# Patient Record
Sex: Female | Born: 2000 | Race: White | Hispanic: Yes | Marital: Single | Smoking: Never smoker
Health system: Southern US, Community
[De-identification: ages and names within clinical notes are randomized; demographics above are authoritative.]

## PROBLEM LIST (undated history)

## (undated) DIAGNOSIS — R519 Headache, unspecified: Secondary | ICD-10-CM

## (undated) DIAGNOSIS — R51 Headache: Secondary | ICD-10-CM

## (undated) DIAGNOSIS — Z789 Other specified health status: Secondary | ICD-10-CM

## (undated) DIAGNOSIS — O24419 Gestational diabetes mellitus in pregnancy, unspecified control: Secondary | ICD-10-CM

## (undated) HISTORY — DX: Headache, unspecified: R51.9

## (undated) HISTORY — DX: Headache: R51

## (undated) HISTORY — DX: Other specified health status: Z78.9

---
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
## 2022-03-07 ENCOUNTER — Ambulatory Visit (INDEPENDENT_AMBULATORY_CARE_PROVIDER_SITE_OTHER): Payer: Medicaid Other

## 2022-03-07 ENCOUNTER — Other Ambulatory Visit (HOSPITAL_COMMUNITY)
Admission: RE | Admit: 2022-03-07 | Discharge: 2022-03-07 | Disposition: A | Discharge status: Home / Self Care | Payer: Medicaid Other | Source: Ambulatory Visit | Attending: Obstetrics and Gynecology | Admitting: Obstetrics and Gynecology

## 2022-03-07 ENCOUNTER — Ambulatory Visit: Payer: Self-pay

## 2022-03-07 VITALS — BP 99/62 | HR 79 | Ht 61.0 in | Wt 168.4 lb

## 2022-03-07 DIAGNOSIS — Z3400 Encounter for supervision of normal first pregnancy, unspecified trimester: Secondary | ICD-10-CM

## 2022-03-07 DIAGNOSIS — Z3201 Encounter for pregnancy test, result positive: Secondary | ICD-10-CM

## 2022-03-07 DIAGNOSIS — N898 Other specified noninflammatory disorders of vagina: Secondary | ICD-10-CM

## 2022-03-07 DIAGNOSIS — N912 Amenorrhea, unspecified: Secondary | ICD-10-CM

## 2022-03-07 DIAGNOSIS — O0993 Supervision of high risk pregnancy, unspecified, third trimester: Secondary | ICD-10-CM | POA: Insufficient documentation

## 2022-03-07 LAB — POCT URINE PREGNANCY: Preg Test, Ur: POSITIVE — AB

## 2022-03-07 MED ORDER — VITAFOL GUMMIES 3.33-0.333-34.8 MG PO CHEW: 3.0000 | CHEWABLE_TABLET | Freq: Every day | ORAL | 11 refills | Status: DC

## 2022-03-07 NOTE — Progress Notes (Signed)
Kathryn Doyle presents today for UPT. She has no unusual complaints. LMP: 10/09/21    OBJECTIVE: Appears well, in no apparent distress.  OB History     Gravida  1   Para  0   Term  0   Preterm  0   AB  0   Living  0      SAB  0   IAB  0   Ectopic  0   Multiple  0   Live Births  0          Home UPT Result: Positive In-Office UPT result: Positive I have reviewed the patient's medical, obstetrical, social, and family histories, and medications.   ASSESSMENT: Positive pregnancy test  PLAN Prenatal care to be completed at: [facility] Schedule for New OB intake Schedule for New OB provider     SUBJECTIVE:  21 y.o. female complains of white, malodorous, and thick vaginal discharge for 1 week(s). Denies abnormal vaginal bleeding or significant pelvic pain or fever. No UTI symptoms. Denies history of known exposure to STD.  Patient's last menstrual period was 10/09/2021.  OBJECTIVE:  She appears well, afebrile. Urine dipstick: not done.  ASSESSMENT:  Vaginal Discharge  Vaginal Odor   PLAN:  GC, chlamydia, trichomonas, BVAG, CVAG probe sent to lab. Treatment: To be determined once lab results are received ROV prn if symptoms persist or worsen.

## 2022-03-08 NOTE — Progress Notes (Signed)
Agree with nurses's documentation of this patient's clinic encounter.  Aiyannah Fayad L, MD  

## 2022-03-09 LAB — CERVICOVAGINAL ANCILLARY ONLY
Bacterial Vaginitis (gardnerella): NEGATIVE
Candida Glabrata: NEGATIVE
Candida Vaginitis: NEGATIVE
Chlamydia: NEGATIVE
Comment: NEGATIVE
Comment: NEGATIVE
Comment: NEGATIVE
Comment: NEGATIVE
Comment: NEGATIVE
Comment: NORMAL
Neisseria Gonorrhea: NEGATIVE
Trichomonas: NEGATIVE

## 2022-03-29 ENCOUNTER — Other Ambulatory Visit: Payer: Self-pay | Admitting: Obstetrics and Gynecology

## 2022-03-29 ENCOUNTER — Other Ambulatory Visit (HOSPITAL_COMMUNITY)
Admission: RE | Admit: 2022-03-29 | Discharge: 2022-03-29 | Disposition: A | Discharge status: Home / Self Care | Payer: Medicaid Other | Source: Ambulatory Visit | Attending: Obstetrics | Admitting: Obstetrics

## 2022-03-29 ENCOUNTER — Ambulatory Visit (INDEPENDENT_AMBULATORY_CARE_PROVIDER_SITE_OTHER): Payer: Medicaid Other | Admitting: General Practice

## 2022-03-29 VITALS — BP 106/68 | HR 94 | Ht 61.0 in | Wt 170.6 lb

## 2022-03-29 DIAGNOSIS — N898 Other specified noninflammatory disorders of vagina: Secondary | ICD-10-CM | POA: Insufficient documentation

## 2022-03-29 DIAGNOSIS — Z113 Encounter for screening for infections with a predominantly sexual mode of transmission: Secondary | ICD-10-CM

## 2022-03-29 MED ORDER — TERCONAZOLE 0.4 % VA CREA: 1.0000 | TOPICAL_CREAM | Freq: Every day | VAGINAL | 0 refills | Status: DC

## 2022-03-29 NOTE — Progress Notes (Signed)
SUBJECTIVE:  21 y.o. female complains of vaginal burning and irritation for 1 month(s). Denies abnormal vaginal bleeding or significant pelvic pain or fever. No UTI symptoms. Denies history of known exposure to STD.  Patient's last menstrual period was 10/09/2021.  OBJECTIVE:  She appears well, afebrile. Urine dipstick: not done.  ASSESSMENT:  Vaginal Discharge  Vaginal Odor   PLAN:  GC, chlamydia, trichomonas, BVAG, CVAG probe sent to lab. Treatment: Terazol sent to pharmacy per protocol. Further treatment to be determined once lab results are received ROV prn if symptoms persist or worsen.

## 2022-03-30 NOTE — Progress Notes (Signed)
Patient was assessed and managed by nursing staff during this encounter. I have reviewed the chart and agree with the documentation and plan. I have also made any necessary editorial changes.  Warden Fillers, MD 03/30/2022 12:03 PM

## 2022-04-02 LAB — CERVICOVAGINAL ANCILLARY ONLY
Bacterial Vaginitis (gardnerella): NEGATIVE
Candida Glabrata: NEGATIVE
Candida Vaginitis: NEGATIVE
Chlamydia: NEGATIVE
Comment: NEGATIVE
Comment: NEGATIVE
Comment: NEGATIVE
Comment: NEGATIVE
Comment: NEGATIVE
Comment: NORMAL
Neisseria Gonorrhea: NEGATIVE
Trichomonas: NEGATIVE

## 2022-04-03 ENCOUNTER — Ambulatory Visit: Payer: Medicaid Other | Admitting: *Deleted

## 2022-04-03 DIAGNOSIS — Z3400 Encounter for supervision of normal first pregnancy, unspecified trimester: Secondary | ICD-10-CM

## 2022-04-03 MED ORDER — BLOOD PRESSURE KIT DEVI: 1.0000 | 0 refills | Status: DC

## 2022-04-03 NOTE — Progress Notes (Signed)
New OB Intake   Spanish Interpreter [phone]  I connected withNAME@ on 04/03/22 at 10:15 AM EST by telephone Visit and verified that I am speaking with the correct person using two identifiers. Nurse is located at [facility] and pt is located at [place].  I discussed the limitations, risks, security and privacy concerns of performing an evaluation and management service by telephone and the availability of in person appointments. I also discussed with the patient that there may be a patient responsible charge related to this service. The patient expressed understanding and agreed to proceed.  I explained I am completing New OB Intake today. We discussed EDD of 08/03/22 that is based on [place] in [place] on 02/07/22 at [redacted]w[redacted]d. Pt is G1/P0. I reviewed her allergies, medications, Medical/Surgical/OB history, and appropriate screenings. I informed her of [facility] services. [facility] information placed in AVS. Based on history, this is a low risk pregnancy.  Patient Active Problem List   Diagnosis Date Noted   Encntr for suprvsn of normal first pregnancy, unsp trimester 03/07/2022   Migraine without aura and without status migrainosus, not intractable 07/01/2018   Episodic tension-type headache, not intractable 07/01/2018   Amenorrhea 07/01/2018    Concerns addressed today  Delivery Plans Plans to deliver at [facility]. Patient given information for [facility] website for [facility]. Patient is not interested in water birth. Offered upcoming OB visit with CNM to discuss further.  MyChart/Babyscripts MyChart access verified. I explained pt will have some visits in office and some virtually. Babyscripts instructions given and order placed. Patient verifies receipt of registration text/e-mail. Account successfully created and app downloaded.  Blood Pressure Cuff/Weight Scale Blood pressure cuff ordered for patient to pick-up from [employer]. Explained after first  prenatal appt pt will check weekly and document in Babyscripts. Patient does not have weight scale; patient may purchase if they desire to track weight weekly in Babyscripts.  Anatomy US Explained first scheduled [place] will be around 19 weeks. Anatomy US scheduled for 04/10/22 at [facility]. Pt notified to arrive at 12:30.  Labs Discussed Avelina Laine genetic screening with patient. Would like both Panorama and Horizon drawn at new OB visit. Routine prenatal labs needed.  COVID Vaccine Patient has had COVID vaccine.   Social Determinants of Health Food Insecurity: Patient denies food insecurity. WIC Referral: Patient is interested in referral to [facility].  Transportation: Patient denies transportation needs. Childcare: Discussed no children allowed at ultrasound appointments. Offered childcare services; patient declines childcare services at this time.  First visit review I reviewed new OB appt with patient. I explained they will have a provider visit that includes vaginal swab and labs. Explained pt will be seen by Dr. Clearance Coots at first visit; encounter routed to appropriate provider. Explained that patient will be seen by pregnancy navigator following visit with provider.   Harrel Lemon, RN 04/03/2022  10:18 AM

## 2022-04-11 ENCOUNTER — Other Ambulatory Visit (HOSPITAL_COMMUNITY)
Admission: RE | Admit: 2022-04-11 | Discharge: 2022-04-11 | Disposition: A | Discharge status: Home / Self Care | Payer: Medicaid Other | Source: Ambulatory Visit | Attending: Obstetrics | Admitting: Obstetrics

## 2022-04-11 ENCOUNTER — Ambulatory Visit (INDEPENDENT_AMBULATORY_CARE_PROVIDER_SITE_OTHER): Payer: Medicaid Other

## 2022-04-11 ENCOUNTER — Ambulatory Visit: Payer: Medicaid Other | Admitting: Licensed Clinical Social Worker

## 2022-04-11 ENCOUNTER — Ambulatory Visit: Payer: Medicaid Other | Attending: Obstetrics and Gynecology

## 2022-04-11 ENCOUNTER — Ambulatory Visit: Payer: Medicaid Other | Admitting: *Deleted

## 2022-04-11 VITALS — BP 127/69 | HR 90 | Wt 175.0 lb

## 2022-04-11 VITALS — BP 109/48 | HR 76

## 2022-04-11 DIAGNOSIS — Z3A23 23 weeks gestation of pregnancy: Secondary | ICD-10-CM | POA: Insufficient documentation

## 2022-04-11 DIAGNOSIS — Z3400 Encounter for supervision of normal first pregnancy, unspecified trimester: Secondary | ICD-10-CM

## 2022-04-11 DIAGNOSIS — O3482 Maternal care for other abnormalities of pelvic organs, second trimester: Secondary | ICD-10-CM | POA: Insufficient documentation

## 2022-04-11 DIAGNOSIS — O99210 Obesity complicating pregnancy, unspecified trimester: Secondary | ICD-10-CM | POA: Insufficient documentation

## 2022-04-11 DIAGNOSIS — O219 Vomiting of pregnancy, unspecified: Secondary | ICD-10-CM | POA: Diagnosis not present

## 2022-04-11 DIAGNOSIS — Z363 Encounter for antenatal screening for malformations: Secondary | ICD-10-CM | POA: Diagnosis not present

## 2022-04-11 DIAGNOSIS — O99212 Obesity complicating pregnancy, second trimester: Secondary | ICD-10-CM | POA: Diagnosis not present

## 2022-04-11 DIAGNOSIS — Z3402 Encounter for supervision of normal first pregnancy, second trimester: Secondary | ICD-10-CM | POA: Diagnosis not present

## 2022-04-11 MED ORDER — ONDANSETRON HCL 4 MG PO TABS: 4.0000 mg | ORAL_TABLET | Freq: Three times a day (TID) | ORAL | 1 refills | Status: DC | PRN

## 2022-04-11 MED ORDER — GOJJI WEIGHT SCALE MISC: 1.0000 | 0 refills | Status: DC | PRN

## 2022-04-11 MED ORDER — ASPIRIN 81 MG PO TBEC: 81.0000 mg | DELAYED_RELEASE_TABLET | Freq: Every day | ORAL | 2 refills | Status: DC

## 2022-04-11 NOTE — Progress Notes (Signed)
NOB in office, intake completed on 04/03/22

## 2022-04-11 NOTE — Progress Notes (Signed)
Subjective:   Kathryn Doyle is a 21 y.o. G1P0000 at 7w5dby 2nd trimester ultrasound being seen today for her first obstetrical visit.  Patient states this was a planned pregnancy.  Patient reports she not on birth control prior to conception. She has taken the pill in the past for menstrual regulation.   Gynecological/Obstetrical History: Patient reports no history of gynecological surgeries.  Patient without history of abnormal pap smears.   Pregnancy history fully reviewed. Patient does not intend to breast feed. Patient obstetrical history is significant for obesity and nulliparity   Sexual Activity and Vaginal Concerns: Patient is not currently sexually active and therefore pain or discomfort during intercourse was not assessed.  She endorses vaginal odor and burning. She denies discharge, or bleeding. Patient also denies pain or difficulty with urination.    Medical History/ROS: Patient denies medical history significant for cardiovascular, respiratory, gastrointestinal, or hematological disorders. Patient also denies history of MH disorders including anxiety and/or depression.   Patient denies constipation/diarrhea.  No recurrent headaches. She does have a history of migraines.   Patient reports nausea/vomiting up to 3x/day.  She reports she is not taking anything for that.     Social History: Patient denies history or current usage of tobacco, alcohol, or drugs.  Patient reports the FOB is RRoyann Shiverswho is involved, supportive, and not present today d/t work.  Patient reports that she lives with sister and her niece and nephew.  She endorses safety at home.  Patient denies DV/A. Patient is not currently employed.  HISTORY: OB History  Gravida Para Term Preterm AB Living  1 0 0 0 0 0  SAB IAB Ectopic Multiple Live Births  0 0 0 0 0    # Outcome Date GA Lbr Len/2nd Weight Sex Delivery Anes PTL Lv  1 Current             No pap smear on file or completed d/t  age.  Past Medical History:  Diagnosis Date   Headache    No pertinent past medical history    Past Surgical History:  Procedure Laterality Date   CHOLECYSTECTOMY  02/2020   Family History  Problem Relation Age of Onset   Stroke Father    Hypertension Father    Diabetes Father    Hypertension Mother    Diabetes Mother    Leukemia Other    Social History   Tobacco Use   Smoking status: Never   Smokeless tobacco: Never  Vaping Use   Vaping Use: Never used  Substance Use Topics   Alcohol use: No    Alcohol/week: 0.0 standard drinks of alcohol   Drug use: No   No Known Allergies Current Outpatient Medications on File Prior to Visit  Medication Sig Dispense Refill   Blood Pressure Monitoring (BLOOD PRESSURE KIT) DEVI 1 Device by Does not apply route once a week. 1 each 0   calcium carbonate (TUMS - DOSED IN MG ELEMENTAL CALCIUM) 500 MG chewable tablet Chew 1 tablet by mouth daily.     Prenatal Vit-Fe Fumarate-FA (BL PRENATAL VITAMINS PO) Take 1 tablet by mouth daily.     omeprazole (PRILOSEC) 20 MG capsule Take 1 capsule (20 mg total) by mouth daily. 30 capsule 0   Prenatal Vit-Fe Phos-FA-Omega (VITAFOL GUMMIES) 3.33-0.333-34.8 MG CHEW Chew 3 tablets by mouth daily. (Patient not taking: Reported on 04/03/2022) 90 tablet 11   terconazole (TERAZOL 7) 0.4 % vaginal cream Place 1 applicator vaginally at bedtime. (Patient  not taking: Reported on 04/11/2022) 45 g 0   [DISCONTINUED] albuterol (PROVENTIL HFA;VENTOLIN HFA) 108 (90 Base) MCG/ACT inhaler Inhale 2 puffs into the lungs every 6 (six) hours as needed for wheezing or shortness of breath. 1 Inhaler 2   No current facility-administered medications on file prior to visit.    Review of Systems Pertinent items noted in HPI and remainder of comprehensive ROS otherwise negative.  Exam   Vitals:   04/11/22 0822  BP: 127/69  Pulse: 90  Weight: 175 lb (79.4 kg)   Fetal Heart Rate (bpm): 147  Physical Exam Constitutional:       Appearance: Normal appearance.  HENT:     Head: Normocephalic and atraumatic.  Eyes:     Conjunctiva/sclera: Conjunctivae normal.  Cardiovascular:     Rate and Rhythm: Normal rate and regular rhythm.     Pulses: Normal pulses.  Pulmonary:     Effort: Pulmonary effort is normal. No respiratory distress.     Breath sounds: Normal breath sounds.  Abdominal:     General: Bowel sounds are normal.     Palpations: Abdomen is soft.     Tenderness: There is no abdominal tenderness.     Comments: FH 26cm  Musculoskeletal:        General: Normal range of motion.     Cervical back: Normal range of motion.  Neurological:     Mental Status: She is alert and oriented to person, place, and time.  Skin:    General: Skin is warm and dry.  Psychiatric:        Mood and Affect: Mood normal.        Behavior: Behavior normal.  Vitals reviewed.     Assessment:   21 y.o. year old G1P0000 Patient Active Problem List   Diagnosis Date Noted   Obesity in pregnancy 04/11/2022   Encntr for suprvsn of normal first pregnancy, unsp trimester 03/07/2022     Plan:  1. Encntr for suprvsn of normal first pregnancy, unsp trimester -Congratulations given and patient welcomed to practice. -Discussed usage of Babyscripts and virtual visits as additional source of managing and completing PN visits.   *Instructed to take blood pressure and record weekly into babyscripts. *Reviewed prenatal visit schedule and platforms used for virtual visits.  -Anticipatory guidance for prenatal visits including labs, ultrasounds, and testing; Initial labs done. -Genetic Screening discussed, First trimester screen, Quad screen, and NIPS: ordered. -Encouraged to complete and utilize MyChart Registration for her ability to review results, send requests, and have questions addressed.  -Discussed estimated due date of August 03, 2022. -Ultrasound discussed; fetal anatomic survey:  scheduled for today at 1300 . -Continue  prenatal vitamins  -Influenza offered and declined. -Encouraged to seek out care at office or emergency room for urgent and/or emergent concerns. -Educated on the nature of [place] with multiple MDs and other Advanced Practice Providers was explained to patient; also emphasized that residents, students are part of our team. Informed of her right to refuse care as she deems appropriate.  -No questions or concerns.    2. [redacted] weeks gestation of pregnancy -Doing well overall. -Discussed next appt with GTT. -Reviewed Glucola appt preparation including fasting the night before and morning of.   *Informed that it is okay to drink plain water throughout the night and prior to consumption of Glucola formula.  -Discussed anticipated office time of 2.5-3 hours.   -Discussed how results of GTT are handled including diabetic education and  BS testing for abnormal results and routine care for normal results.   3. Nausea and vomiting in pregnancy -Patient requests and sent script for zofran to pharmacy on file. -Cautioned re: continuous usage and side effect of constipation.   4. Obesity in pregnancy -BMI 33 today -TWG 15lbs -Discussed increased risked of PreE and bASA initiation based on BMI, Nulliparity, and Hispanic Heritage. -Patient agreeable  -Script for bASA sent to pharmacy on file.   Problem list reviewed and updated. Routine obstetric precautions reviewed.  No orders of the defined types were placed in this encounter.   No follow-ups on file.     Maryann Conners, CNM 04/11/2022 8:41 AM

## 2022-04-11 NOTE — BH Specialist Note (Unsigned)
 Opened in error

## 2022-04-12 LAB — CBC/D/PLT+RPR+RH+ABO+RUBIGG...
Antibody Screen: NEGATIVE
Basophils Absolute: 0 10*3/uL (ref 0.0–0.2)
Basos: 0 %
EOS (ABSOLUTE): 0.1 10*3/uL (ref 0.0–0.4)
Eos: 1 %
HCV Ab: NONREACTIVE
HIV Screen 4th Generation wRfx: NONREACTIVE
Hematocrit: 33.5 % — ABNORMAL LOW (ref 34.0–46.6)
Hemoglobin: 11.7 g/dL (ref 11.1–15.9)
Hepatitis B Surface Ag: NEGATIVE
Immature Grans (Abs): 0.1 10*3/uL (ref 0.0–0.1)
Immature Granulocytes: 1 %
Lymphocytes Absolute: 1.8 10*3/uL (ref 0.7–3.1)
Lymphs: 17 %
MCH: 32.7 pg (ref 26.6–33.0)
MCHC: 34.9 g/dL (ref 31.5–35.7)
MCV: 94 fL (ref 79–97)
Monocytes Absolute: 0.5 10*3/uL (ref 0.1–0.9)
Monocytes: 5 %
Neutrophils Absolute: 8.2 10*3/uL — ABNORMAL HIGH (ref 1.4–7.0)
Neutrophils: 76 %
Platelets: 329 10*3/uL (ref 150–450)
RBC: 3.58 x10E6/uL — ABNORMAL LOW (ref 3.77–5.28)
RDW: 11.6 % — ABNORMAL LOW (ref 11.7–15.4)
RPR Ser Ql: NONREACTIVE
Rh Factor: POSITIVE
Rubella Antibodies, IGG: 7 index (ref >0.99)
WBC: 10.6 10*3/uL (ref 3.4–10.8)

## 2022-04-12 LAB — CERVICOVAGINAL ANCILLARY ONLY
Bacterial Vaginitis (gardnerella): NEGATIVE
Candida Glabrata: NEGATIVE
Candida Vaginitis: NEGATIVE
Chlamydia: NEGATIVE
Comment: NEGATIVE
Comment: NEGATIVE
Comment: NEGATIVE
Comment: NEGATIVE
Comment: NEGATIVE
Comment: NORMAL
Neisseria Gonorrhea: NEGATIVE
Trichomonas: NEGATIVE

## 2022-04-12 LAB — HCV INTERPRETATION

## 2022-04-13 LAB — CULTURE, OB URINE

## 2022-04-13 LAB — AFP, SERUM, OPEN SPINA BIFIDA
AFP MoM: 0.87
AFP Value: 71.7 ng/mL
Gest. Age on Collection Date: 23.5 weeks
Maternal Age At EDD: 21 yr
OSBR Risk 1 IN: 10000
Test Results:: NEGATIVE
Weight: 175 [lb_av]

## 2022-04-13 LAB — URINE CULTURE, OB REFLEX: Organism ID, Bacteria: NO GROWTH

## 2022-04-16 LAB — PANORAMA PRENATAL TEST FULL PANEL:PANORAMA TEST PLUS 5 ADDITIONAL MICRODELETIONS: FETAL FRACTION: 9

## 2022-04-18 ENCOUNTER — Telehealth: Payer: Self-pay | Admitting: *Deleted

## 2022-04-18 LAB — HORIZON CUSTOM: REPORT SUMMARY: NEGATIVE

## 2022-04-18 LAB — SPECIMEN STATUS REPORT

## 2022-04-18 NOTE — Telephone Encounter (Signed)
TC from pt concerned with lab results from 04/11/22. Reassured that results have been reviewed by provider and there are no interventions needed at this time. Discussed normal physiologic changes of pregnancy and resulting changes in lab values. Pt verbalized understanding and reassurance.

## 2022-05-02 ENCOUNTER — Other Ambulatory Visit: Payer: Medicaid Other

## 2022-05-02 ENCOUNTER — Ambulatory Visit (INDEPENDENT_AMBULATORY_CARE_PROVIDER_SITE_OTHER): Payer: Medicaid Other | Admitting: Obstetrics & Gynecology

## 2022-05-02 ENCOUNTER — Ambulatory Visit (INDEPENDENT_AMBULATORY_CARE_PROVIDER_SITE_OTHER): Payer: Medicaid Other | Admitting: Licensed Clinical Social Worker

## 2022-05-02 ENCOUNTER — Encounter: Payer: Self-pay | Admitting: Obstetrics & Gynecology

## 2022-05-02 VITALS — BP 103/64 | HR 86 | Wt 174.0 lb

## 2022-05-02 DIAGNOSIS — Z3A26 26 weeks gestation of pregnancy: Secondary | ICD-10-CM | POA: Diagnosis not present

## 2022-05-02 DIAGNOSIS — Z3400 Encounter for supervision of normal first pregnancy, unspecified trimester: Secondary | ICD-10-CM

## 2022-05-02 DIAGNOSIS — O24419 Gestational diabetes mellitus in pregnancy, unspecified control: Secondary | ICD-10-CM

## 2022-05-02 DIAGNOSIS — Z1332 Encounter for screening for maternal depression: Secondary | ICD-10-CM

## 2022-05-02 DIAGNOSIS — K219 Gastro-esophageal reflux disease without esophagitis: Secondary | ICD-10-CM

## 2022-05-02 MED ORDER — PANTOPRAZOLE SODIUM 20 MG PO TBEC: 20.0000 mg | DELAYED_RELEASE_TABLET | Freq: Every day | ORAL | 1 refills | Status: DC

## 2022-05-02 NOTE — Progress Notes (Signed)
ROB/GTT. 

## 2022-05-02 NOTE — Progress Notes (Signed)
   PRENATAL VISIT NOTE  Subjective:  Kathryn Doyle is a 21 y.o. G1P0000 at 44w5dbeing seen today for ongoing prenatal care.  She is currently monitored for the following issues for this low-risk pregnancy and has Encntr for suprvsn of normal first pregnancy, unsp trimester and Obesity in pregnancy on their problem list.  Patient reports heartburn, nausea, and vomiting.  Contractions: Not present. Vag. Bleeding: None.  Movement: Present. Denies leaking of fluid.   The following portions of the patient's history were reviewed and updated as appropriate: allergies, current medications, past family history, past medical history, past social history, past surgical history and problem list.   Objective:   Vitals:   05/02/22 0846  BP: 103/64  Pulse: 86  Weight: 174 lb (78.9 kg)    Fetal Status: Fetal Heart Rate (bpm): 145 Fundal Height: 27 cm Movement: Present     General:  Alert, oriented and cooperative. Patient is in no acute distress.  Skin: Skin is warm and dry. No rash noted.   Cardiovascular: Normal heart rate noted  Respiratory: Normal respiratory effort, no problems with respiration noted  Abdomen: Soft, gravid, appropriate for gestational age.  Pain/Pressure: Present     Pelvic: Cervical exam deferred        Extremities: Normal range of motion.  Edema: None  Mental Status: Normal mood and affect. Normal behavior. Normal judgment and thought content.   Assessment and Plan:  Pregnancy: G1P0000 at [redacted]w[redacted]d. Encntr for suprvsn of normal first pregnancy, unsp trimester 28 week labs - Comp Met (CMET) - Glucose Tolerance, 2 Hours w/1 Hour - RPR - CBC - HIV antibody (with reflex) - pantoprazole (PROTONIX) 20 MG tablet; Take 1 tablet (20 mg total) by mouth daily.  Dispense: 30 tablet; Refill: 1  2. Gastroesophageal reflux disease, unspecified whether esophagitis present Meds ordered this encounter  Medications   pantoprazole (PROTONIX) 20 MG tablet    Sig: Take 1 tablet (20 mg  total) by mouth daily.    Dispense:  30 tablet    Refill:  1     Preterm labor symptoms and general obstetric precautions including but not limited to vaginal bleeding, contractions, leaking of fluid and fetal movement were reviewed in detail with the patient. Please refer to After Visit Summary for other counseling recommendations.   Return in about 4 weeks (around 05/30/2022).  Future Appointments  Date Time Provider [place]12/20/2023  9:30 AM FiLynnea FerrierLCSW [facility] None  05/04/2022  4:00 PM Tobb, KaGodfrey PickDO [facility] None    JaEmeterio ReeveMD

## 2022-05-03 ENCOUNTER — Other Ambulatory Visit: Payer: Self-pay | Admitting: Emergency Medicine

## 2022-05-03 DIAGNOSIS — Z3400 Encounter for supervision of normal first pregnancy, unspecified trimester: Secondary | ICD-10-CM

## 2022-05-03 LAB — COMPREHENSIVE METABOLIC PANEL
ALT: 16 IU/L (ref 0–32)
AST: 15 IU/L (ref 0–40)
Albumin/Globulin Ratio: 1.3 (ref 1.2–2.2)
Albumin: 3.5 g/dL — ABNORMAL LOW (ref 4.0–5.0)
Alkaline Phosphatase: 73 IU/L (ref 42–106)
BUN/Creatinine Ratio: 13 (ref 9–23)
BUN: 6 mg/dL (ref 6–20)
Bilirubin Total: 0.3 mg/dL (ref 0.0–1.2)
CO2: 20 mmol/L (ref 20–29)
Calcium: 8.8 mg/dL (ref 8.7–10.2)
Chloride: 107 mmol/L — ABNORMAL HIGH (ref 96–106)
Creatinine, Ser: 0.45 mg/dL — ABNORMAL LOW (ref 0.57–1.00)
Globulin, Total: 2.7 g/dL (ref 1.5–4.5)
Glucose: 80 mg/dL (ref 70–99)
Potassium: 4.1 mmol/L (ref 3.5–5.2)
Sodium: 141 mmol/L (ref 134–144)
Total Protein: 6.2 g/dL (ref 6.0–8.5)
eGFR: 141 mL/min/{1.73_m2} (ref >59)

## 2022-05-03 LAB — HIV ANTIBODY (ROUTINE TESTING W REFLEX): HIV Screen 4th Generation wRfx: NONREACTIVE

## 2022-05-03 LAB — CBC
Hematocrit: 35.2 % (ref 34.0–46.6)
Hemoglobin: 12.1 g/dL (ref 11.1–15.9)
MCH: 31.9 pg (ref 26.6–33.0)
MCHC: 34.4 g/dL (ref 31.5–35.7)
MCV: 93 fL (ref 79–97)
Platelets: 337 10*3/uL (ref 150–450)
RBC: 3.79 x10E6/uL (ref 3.77–5.28)
RDW: 11.9 % (ref 11.7–15.4)
WBC: 12.4 10*3/uL — ABNORMAL HIGH (ref 3.4–10.8)

## 2022-05-03 LAB — GLUCOSE TOLERANCE, 2 HOURS W/ 1HR
Glucose, 1 hour: 180 mg/dL — ABNORMAL HIGH (ref 70–179)
Glucose, 2 hour: 121 mg/dL (ref 70–152)
Glucose, Fasting: 76 mg/dL (ref 70–91)

## 2022-05-03 LAB — RPR: RPR Ser Ql: NONREACTIVE

## 2022-05-03 MED ORDER — PANTOPRAZOLE SODIUM 20 MG PO TBEC: 20.0000 mg | DELAYED_RELEASE_TABLET | Freq: Every day | ORAL | 1 refills | Status: DC

## 2022-05-03 NOTE — BH Specialist Note (Signed)
Integrated Behavioral Health Initial In-Person Visit  MRN: [id number] Name: Kathryn Doyle  Number of Integrated Behavioral Health Clinician visits: 1 Session Start time:   9:30am Session End time: 9:45am Total time in minutes: 15 mins in person at [facility]   Types of Service: General Behavioral Integrated Care (BHI)  Interpretor:No. Interpretor Name and Language: none   Warm Hand Off Completed.        Subjective: Kathryn Doyle is a 21 y.o. female accompanied by n/a Patient was referred by Herbie Baltimore RN for new ob intro. Patient reports the following symptoms/concerns: new ob intro  Duration of problem: n/a; Severity of problem: na  Objective: Mood: good and Affect: Appropriate Risk of harm to self or others: No plan to harm self or others  Life Context: Family and Social: Lives with family  School/Work: n/a Self-Care: n/a Life Changes: new pregnancy  Patient and/or Family's Strengths/Protective Factors: Concrete supports in place (healthy food, safe environments, etc.)  Goals Addressed: Patient will: Keep medical and prenatal appts  Prioritize rest  Take prenatal vitamins  Engage in stress reducing activity and healthy diet   Gwyndolyn Saxon, LCSW

## 2022-05-03 NOTE — Progress Notes (Signed)
Rx resent to alternate pharmacy.

## 2022-05-04 ENCOUNTER — Other Ambulatory Visit: Payer: Self-pay | Admitting: Emergency Medicine

## 2022-05-04 ENCOUNTER — Ambulatory Visit (INDEPENDENT_AMBULATORY_CARE_PROVIDER_SITE_OTHER): Payer: Medicaid Other | Admitting: Cardiology

## 2022-05-04 ENCOUNTER — Encounter: Payer: Self-pay | Admitting: Cardiology

## 2022-05-04 ENCOUNTER — Encounter: Payer: Self-pay | Admitting: Obstetrics & Gynecology

## 2022-05-04 VITALS — BP 110/70 | HR 110 | Ht 61.0 in | Wt 176.5 lb

## 2022-05-04 DIAGNOSIS — R0602 Shortness of breath: Secondary | ICD-10-CM | POA: Diagnosis not present

## 2022-05-04 DIAGNOSIS — O24410 Gestational diabetes mellitus in pregnancy, diet controlled: Secondary | ICD-10-CM

## 2022-05-04 DIAGNOSIS — O24419 Gestational diabetes mellitus in pregnancy, unspecified control: Secondary | ICD-10-CM | POA: Insufficient documentation

## 2022-05-04 MED ORDER — ACCU-CHEK MULTICLIX LANCETS MISC: 12 refills | Status: DC

## 2022-05-04 MED ORDER — ACCU-CHEK GUIDE VI STRP: ORAL_STRIP | 12 refills | Status: DC

## 2022-05-04 MED ORDER — ACCU-CHEK GUIDE ME W/DEVICE KIT: 1.0000 | PACK | Freq: Four times a day (QID) | 0 refills | Status: DC

## 2022-05-04 NOTE — Patient Instructions (Signed)
Medication Instructions:  Your physician recommends that you continue on your current medications as directed. Please refer to the Current Medication list given to you today.  *If you need a refill on your cardiac medications before your next appointment, please call your pharmacy*   Lab Work: NONE If you have labs (blood work) drawn today and your tests are completely normal, you will receive your results only by: MyChart Message (if you have MyChart) OR A paper copy in the mail If you have any lab test that is abnormal or we need to change your treatment, we will call you to review the results.   Testing/Procedures: NONE   Follow-Up: At [facility], you and your health needs are our priority.  As part of our continuing mission to provide you with exceptional heart care, we have created designated Provider Care Teams.  These Care Teams include your primary Cardiologist (physician) and Advanced Practice Providers (APPs -  Physician Assistants and Nurse Practitioners) who all work together to provide you with the care you need, when you need it.  We recommend signing up for the patient portal called "MyChart".  Sign up information is provided on this After Visit Summary.  MyChart is used to connect with patients for Virtual Visits (Telemedicine).  Patients are able to view lab/test results, encounter notes, upcoming appointments, etc.  Non-urgent messages can be sent to your provider as well.   To learn more about what you can do with MyChart, go to [email or web address].    Your next appointment:   As Needed   The format for your next appointment:   In Person  Provider:   Kardie Tobb, DO  

## 2022-05-04 NOTE — Addendum Note (Signed)
Addended by: Adam Phenix on: 05/04/2022 06:48 AM   Modules accepted: Orders

## 2022-05-04 NOTE — Progress Notes (Unsigned)
Cardio-Obstetrics Clinic  New Evaluation  Date:  05/05/2022   ID:  Kathryn Doyle, DOB 12-May-2001, MRN [id number]  PCP:  Wende Neighbors, MD   [place] Providers Cardiologist:  None  Electrophysiologist:  None       Referring MD: Gavin Pound, CNM   Chief Complaint: I am doing fine  History of Present Illness:    Kathryn Doyle is a 21 y.o. female [[id number]] who is being seen today for the evaluation of shortness of breath at the request of Gavin Pound, CNM.    Prior CV Studies Reviewed: The following studies were reviewed today: Known  Past Medical History:  Diagnosis Date   Episodic tension-type headache, not intractable 07/01/2018   Headache    Migraine without aura and without status migrainosus, not intractable 07/01/2018   No pertinent past medical history     Past Surgical History:  Procedure Laterality Date   CHOLECYSTECTOMY  02/2020      OB History     Gravida  1   Para  0   Term  0   Preterm  0   AB  0   Living  0      SAB  0   IAB  0   Ectopic  0   Multiple  0   Live Births  0               Current Medications: Current Meds  Medication Sig   Blood Glucose Monitoring Suppl (ACCU-CHEK GUIDE ME) w/Device KIT 1 Device by Does not apply route in the morning, at noon, in the evening, and at bedtime.   calcium carbonate (TUMS - DOSED IN MG ELEMENTAL CALCIUM) 500 MG chewable tablet Chew 1 tablet by mouth daily.   glucose blood (ACCU-CHEK GUIDE) test strip Take blood sugars 4 times a day. Take when fasting before breakfast, after breakfast, after lunch and after dinner.   Lancets (ACCU-CHEK MULTICLIX) lancets Use as instructed   Misc. Devices (GOJJI WEIGHT SCALE) MISC 1 Device by Does not apply route as needed.   Prenatal Vit-Fe Fumarate-FA (BL PRENATAL VITAMINS PO) Take 1 tablet by mouth daily.     Allergies:   Patient has no known allergies.   Social History   Socioeconomic History   Marital status:  Single    Spouse name: Not on file   Number of children: Not on file   Years of education: Not on file   Highest education level: Not on file  Occupational History   Not on file  Tobacco Use   Smoking status: Never   Smokeless tobacco: Never  Vaping Use   Vaping Use: Never used  Substance and Sexual Activity   Alcohol use: No    Alcohol/week: 0.0 standard drinks of alcohol   Drug use: No   Sexual activity: Yes    Partners: Male    Birth control/protection: None    Comment: currently pregnant  Other Topics Concern   Not on file  Social History Narrative   Kathryn Doyle is an 11th grade student.   She attends [employer].   She lives with both parents. She has six sisters.   She enjoys sleeping, shopping, and watching tv.   Social Determinants of Health   Financial Resource Strain: Not on file  Food Insecurity: Not on file  Transportation Needs: Not on file  Physical Activity: Not on file  Stress: Not on file  Social Connections: Not on file      Family History  Problem Relation Age of Onset   Stroke Father    Hypertension Father    Diabetes Father    Hypertension Mother    Diabetes Mother    Leukemia Other       ROS:   Please see the history of present illness.    Mild shortness of breath All other systems reviewed and are negative.   Labs/EKG Reviewed:    EKG:   EKG is was not ordered today.  Recent Labs: 05/02/2022: ALT 16; BUN 6; Creatinine, Ser 0.45; Hemoglobin 12.1; Platelets 337; Potassium 4.1; Sodium 141   Recent Lipid Panel No results found for: "CHOL", "TRIG", "HDL", "CHOLHDL", "LDLCALC", "LDLDIRECT"  Physical Exam:    VS:  BP 110/70   Pulse (!) 110   Ht _0  (1.549 m)   Wt 176 lb 8 oz (80.1 kg)   LMP 10/09/2021   BMI 33.35 kg/m     Wt Readings from Last 3 Encounters:  05/04/22 176 lb 8 oz (80.1 kg)  05/02/22 174 lb (78.9 kg)  04/11/22 175 lb (79.4 kg)     GEN:  Well nourished, well developed in no acute distress HEENT:  Normal NECK: No JVD; No carotid bruits LYMPHATICS: No lymphadenopathy CARDIAC: RRR, no murmurs, rubs, gallops RESPIRATORY:  Clear to auscultation without rales, wheezing or rhonchi  ABDOMEN: Soft, non-tender, non-distended MUSCULOSKELETAL:  No edema; No deformity  SKIN: Warm and dry NEUROLOGIC:  Alert and oriented x 3 PSYCHIATRIC:  Normal affect    Risk Assessment/Risk Calculators:                 ASSESSMENT & PLAN:    Shortness of breath, she does have mild shortness of breath with activity but not worsening and not at rest.  Will continue to monitor.  Patient.  This time there is no need for any further testing if shortness of breath progresses especially at rest we will go ahead and get an echocardiogram.  The patient is in agreement with the above plan. The patient left the office in stable condition.  The patient will follow up in as needed   Patient Instructions  Medication Instructions:  Your physician recommends that you continue on your current medications as directed. Please refer to the Current Medication list given to you today.  *If you need a refill on your cardiac medications before your next appointment, please call your pharmacy*   Lab Work: NONE If you have labs (blood work) drawn today and your tests are completely normal, you will receive your results only by: [place] (if you have MyChart) OR A paper copy in the mail If you have any lab test that is abnormal or we need to change your treatment, we will call you to review the results.   Testing/Procedures: NONE   Follow-Up: At [facility], you and your health needs are our priority.  As part of our continuing mission to provide you with exceptional heart care, we have created designated Provider Care Teams.  These Care Teams include your primary Cardiologist (physician) and Advanced Practice Providers (APPs -  Physician Assistants and Nurse Practitioners) who all work together to  provide you with the care you need, when you need it.  We recommend signing up for the patient portal called "MyChart".  Sign up information is provided on this After Visit Summary.  MyChart is used to connect with patients for Virtual Visits (Telemedicine).  Patients are able to view lab/test results, encounter notes, upcoming appointments, etc.  Non-urgent  messages can be sent to your provider as well.   To learn more about what you can do with MyChart, go to [email or web address].    Your next appointment:   As Needed   The format for your next appointment:   In Person  Provider:   Berniece Salines, DO   Dispo:  Return if symptoms worsen or fail to improve.   Medication Adjustments/Labs and Tests Ordered: Current medicines are reviewed at length with the patient today.  Concerns regarding medicines are outlined above.  Tests Ordered: No orders of the defined types were placed in this encounter.  Medication Changes: No orders of the defined types were placed in this encounter.

## 2022-05-09 ENCOUNTER — Ambulatory Visit: Payer: Medicaid Other

## 2022-05-14 NOTE — L&D Delivery Note (Cosign Needed Addendum)
OB/GYN Faculty Practice Delivery Note  Kathryn Doyle is a 22 y.o. G1P0000 s/p SVD at [redacted]w[redacted]d. She was admitted for IOL for GDM.   ROM: 8h 48m with clear fluid GBS Status:  Negative/-- (02/28 1136) Maximum Maternal Temperature:  Temp (24hrs), Avg:98 F (36.7 C), Min:97.6 F (36.4 C), Max:98.4 F (36.9 C)    Labor Progress: Patient arrived at 0 cm dilation and was induced with misoprostol, pitocin, foley.   Delivery Date/Time: 07/28/2022 at 2136 Delivery: Called to room and patient was complete and pushing. Head delivered in OA position. Loose cord present, reduced after delivery. Shoulder and body delivered in usual fashion. Infant with spontaneous cry, placed on mother's abdomen, dried and stimulated. Cord clamped x 2 after 1-minute delay, and cut by FOB. Cord blood drawn. Placenta delivered spontaneously with gentle cord traction. Fundus firm with massage and Pitocin. Labia, perineum, vagina, and cervix inspected with R periurethral and R sulcul, repaired by Dr. Janus Molder.   Placenta: delivered spontaneously and intact Complications: none Lacerations: R periurethral and R sulcul with 4.0 monocryl and 3.0 vicryl respectively  EBL: 325 Analgesia: epidural   Infant: APGAR (1 MIN): 8   APGAR (5 MINS): 9   APGAR (10 MINS):    Weight: TBD  Arlyce Dice, MD  PGY-1, [facility]  07/28/2022 10:09 PM   GME ATTESTATION:  I saw and evaluated the patient during the procedure. I agree with the findings and the plan of care as documented in the resident's note. I have made changes to documentation as necessary.  Gerlene Fee, DO OB Fellow, [place] for [place] 07/28/2022, 10:22 PM

## 2022-05-16 ENCOUNTER — Encounter: Payer: Medicaid Other | Attending: Obstetrics & Gynecology | Admitting: Registered"

## 2022-05-16 ENCOUNTER — Encounter: Payer: Self-pay | Admitting: Registered"

## 2022-05-16 DIAGNOSIS — O24419 Gestational diabetes mellitus in pregnancy, unspecified control: Secondary | ICD-10-CM | POA: Insufficient documentation

## 2022-05-16 NOTE — Progress Notes (Signed)
The following learning objectives were met by the patient during this course:   States the definition of Gestational Diabetes States why dietary management is important in controlling blood glucose Describes the effects each nutrient has on blood glucose levels Demonstrates ability to create a balanced meal plan Demonstrates carbohydrate counting  States when to check blood glucose levels Demonstrates proper blood glucose monitoring techniques States the effect of stress and exercise on blood glucose levels States the importance of limiting caffeine and abstaining from alcohol and smoking   Blood glucose monitor given: None. Patient has meter and checking blood sugar prior to class.   Patient instructed to monitor glucose levels: FBS: 60 - <95; 1 hour: <140; 2 hour: <120   Patient received handouts: Nutrition Diabetes and Pregnancy, including carb counting list Glucose log sheet   Patient will be seen for follow-up as needed.

## 2022-05-21 ENCOUNTER — Other Ambulatory Visit: Payer: Self-pay | Admitting: Emergency Medicine

## 2022-05-21 MED ORDER — PANTOPRAZOLE SODIUM 20 MG PO TBEC: 20.0000 mg | DELAYED_RELEASE_TABLET | Freq: Every day | ORAL | 1 refills | Status: DC

## 2022-05-21 NOTE — Progress Notes (Signed)
Rx reordered- Pt never picked up from pharmacy from 12/21 order.

## 2022-05-22 ENCOUNTER — Encounter: Payer: Self-pay | Admitting: Obstetrics & Gynecology

## 2022-05-30 ENCOUNTER — Ambulatory Visit (INDEPENDENT_AMBULATORY_CARE_PROVIDER_SITE_OTHER): Payer: Medicaid Other

## 2022-05-30 VITALS — BP 126/74 | HR 109 | Wt 183.8 lb

## 2022-05-30 DIAGNOSIS — Z3A30 30 weeks gestation of pregnancy: Secondary | ICD-10-CM | POA: Diagnosis not present

## 2022-05-30 DIAGNOSIS — Z3400 Encounter for supervision of normal first pregnancy, unspecified trimester: Secondary | ICD-10-CM

## 2022-05-30 DIAGNOSIS — O24410 Gestational diabetes mellitus in pregnancy, diet controlled: Secondary | ICD-10-CM

## 2022-05-30 DIAGNOSIS — O99210 Obesity complicating pregnancy, unspecified trimester: Secondary | ICD-10-CM

## 2022-05-30 LAB — GLUCOSE, POCT (MANUAL RESULT ENTRY): POC Glucose: 146 mg/dl — AB (ref 70–99)

## 2022-05-30 NOTE — Progress Notes (Signed)
   HIGH-RISK PREGNANCY OFFICE VISIT  Patient name: Kathryn Doyle MRN [id number]  Date of birth: 2000/09/01 Chief Complaint:   Routine Prenatal Visit  Subjective:   Kathryn Doyle is a 22 y.o. G35P0000 female at [redacted]w[redacted]d with an Estimated Date of Delivery: 08/03/22 being seen today for ongoing management of a high-risk pregnancy aeb has Encntr for suprvsn of normal first pregnancy, unsp trimester; Obesity in pregnancy; and Gestational diabetes mellitus on their problem list.  Patient presents today, alone, with  pelvic pain .  Patient endorses fetal movement. Patient denies abdominal cramping or contractions.  Patient denies vaginal concerns including abnormal discharge, leaking of fluid, and bleeding. No issues with urination, constipation, or diarrhea. Miss. Amybeth states she has not been checking her CBGs d/t fear of abnormal results.  She endorses having all her equipment and completed diabetic education.    Contractions: Not present. Vag. Bleeding: None.  Movement: Present.  Reviewed past medical,surgical, social, obstetrical and family history as well as problem list, medications and allergies.  Objective   Vitals:   05/30/22 1104  BP: 126/74  Pulse: (!) 109  Weight: 183 lb 12.8 oz (83.4 kg)  Body mass index is 34.73 kg/m.  Total Weight Gain:23 lb 12.8 oz (10.8 kg)         Physical Examination:   General appearance: Well appearing, and in no distress  Mental status: Alert, oriented to person, place, and time  Skin: Warm & dry  Cardiovascular: Normal heart rate noted  Respiratory: Normal respiratory effort, no distress  Abdomen: Soft, gravid, nontender, LGA with    Pelvic: Cervical exam deferred           Extremities: Edema: None  Fetal Status: Fetal Heart Rate (bpm): 140  Movement: Present   No results found for this or any previous visit (from the past 24 hour(s)).  Assessment & Plan:  High-risk pregnancy of a 22 y.o., G1P0000 at [redacted]w[redacted]d with an Estimated Date of Delivery:  08/03/22   1. Encntr for suprvsn of normal first pregnancy, unsp trimester -Anticipatory guidance for upcoming appts. -Patient to schedule next appt in 2 weeks for an in-person visit.  2. [redacted] weeks gestation of pregnancy -Doing well  -Reviewed complaint, reassured normal. Discussed relief measures. -Reviewing pediatricians and considering 2: [facility] or [facility]  3. Diet controlled gestational diabetes mellitus (GDM) in second trimester -CBG 146 at 1130. Reports eating hashbrown and drinking iced coffee at 0900.  -Discussed rationale and importance for CBG monitoring. -Reviewed potential unwanted outcomes with uncontrolled BG including LGA infant resulting in need for C/S delivery, neonate glycemia issues. -Given information on GDM management in AVS -Order placed for antenatal testing and growth FU in 2-3 weeks as schedule allots.       Meds: No orders of the defined types were placed in this encounter.  Labs/procedures today:  Lab Orders  No laboratory test(s) ordered today     Reviewed: Preterm labor symptoms and general obstetric precautions including but not limited to vaginal bleeding, contractions, leaking of fluid and fetal movement were reviewed in detail with the patient.  All questions were answered.  Follow-up: No follow-ups on file.  No orders of the defined types were placed in this encounter.  Maryann Conners MSN, CNM 05/30/2022

## 2022-05-30 NOTE — Progress Notes (Signed)
Patient presents for [place] visit. Pt non-compliant with checking blood sugars. I informed patient of the importance of this, and she verbalized understanding.

## 2022-06-13 ENCOUNTER — Ambulatory Visit (INDEPENDENT_AMBULATORY_CARE_PROVIDER_SITE_OTHER): Payer: Medicaid Other | Admitting: Obstetrics and Gynecology

## 2022-06-13 ENCOUNTER — Encounter: Payer: Self-pay | Admitting: Obstetrics and Gynecology

## 2022-06-13 ENCOUNTER — Ambulatory Visit: Payer: Medicaid Other | Admitting: *Deleted

## 2022-06-13 ENCOUNTER — Other Ambulatory Visit: Payer: Self-pay

## 2022-06-13 ENCOUNTER — Ambulatory Visit: Payer: Medicaid Other

## 2022-06-13 VITALS — BP 129/60 | HR 97

## 2022-06-13 VITALS — BP 110/72 | HR 88 | Wt 180.0 lb

## 2022-06-13 DIAGNOSIS — O99213 Obesity complicating pregnancy, third trimester: Secondary | ICD-10-CM

## 2022-06-13 DIAGNOSIS — O99210 Obesity complicating pregnancy, unspecified trimester: Secondary | ICD-10-CM

## 2022-06-13 DIAGNOSIS — Z3400 Encounter for supervision of normal first pregnancy, unspecified trimester: Secondary | ICD-10-CM

## 2022-06-13 DIAGNOSIS — Z3A32 32 weeks gestation of pregnancy: Secondary | ICD-10-CM | POA: Diagnosis not present

## 2022-06-13 DIAGNOSIS — E669 Obesity, unspecified: Secondary | ICD-10-CM | POA: Diagnosis not present

## 2022-06-13 DIAGNOSIS — O24410 Gestational diabetes mellitus in pregnancy, diet controlled: Secondary | ICD-10-CM

## 2022-06-13 DIAGNOSIS — O24419 Gestational diabetes mellitus in pregnancy, unspecified control: Secondary | ICD-10-CM

## 2022-06-13 DIAGNOSIS — O365990 Maternal care for other known or suspected poor fetal growth, unspecified trimester, not applicable or unspecified: Secondary | ICD-10-CM | POA: Insufficient documentation

## 2022-06-13 DIAGNOSIS — O365930 Maternal care for other known or suspected poor fetal growth, third trimester, not applicable or unspecified: Secondary | ICD-10-CM | POA: Diagnosis not present

## 2022-06-13 NOTE — Progress Notes (Signed)
Subjective:  Kathryn Doyle is a 22 y.o. G1P0000 at [redacted]w[redacted]d being seen today for ongoing prenatal care.  She is currently monitored for the following issues for this high-risk pregnancy and has Encntr for suprvsn of normal first pregnancy, unsp trimester; Obesity in pregnancy; and Gestational diabetes mellitus on their problem list.  Patient reports  general discomforts of pregnancy .  Contractions: Not present. Vag. Bleeding: None.  Movement: Present. Denies leaking of fluid.   The following portions of the patient's history were reviewed and updated as appropriate: allergies, current medications, past family history, past medical history, past social history, past surgical history and problem list. Problem list updated.  Objective:   Vitals:   06/13/22 1102  BP: 110/72  Pulse: 88  Weight: 180 lb (81.6 kg)    Fetal Status: Fetal Heart Rate (bpm): 140   Movement: Present     General:  Alert, oriented and cooperative. Patient is in no acute distress.  Skin: Skin is warm and dry. No rash noted.   Cardiovascular: Normal heart rate noted  Respiratory: Normal respiratory effort, no problems with respiration noted  Abdomen: Soft, gravid, appropriate for gestational age. Pain/Pressure: Present     Pelvic:  Cervical exam deferred        Extremities: Normal range of motion.     Mental Status: Normal mood and affect. Normal behavior. Normal judgment and thought content.   Urinalysis:      Assessment and Plan:  Pregnancy: G1P0000 at [redacted]w[redacted]d  1. Encntr for suprvsn of normal first pregnancy, unsp trimester Stable  2. Gestational diabetes mellitus (GDM) in third trimester, gestational diabetes method of control unspecified Did not bring CBG readings today ? If in goal range, pt states they are Importance of following diet, check CBG's recording and bring CBG readings to allOB appts reviewed U/S today Will have pt return in 1 week to review U/S and CBG readings  Preterm labor symptoms and  general obstetric precautions including but not limited to vaginal bleeding, contractions, leaking of fluid and fetal movement were reviewed in detail with the patient. Please refer to After Visit Summary for other counseling recommendations.  Return in about 1 week (around 06/20/2022) for OB visit, face to face, MD only.   Chancy Milroy, MD

## 2022-06-13 NOTE — Progress Notes (Signed)
Pt is checking sugars at home, did not bring log today.

## 2022-06-13 NOTE — Patient Instructions (Signed)

## 2022-06-14 ENCOUNTER — Other Ambulatory Visit: Payer: Self-pay | Admitting: *Deleted

## 2022-06-14 DIAGNOSIS — O365990 Maternal care for other known or suspected poor fetal growth, unspecified trimester, not applicable or unspecified: Secondary | ICD-10-CM

## 2022-06-14 DIAGNOSIS — O24419 Gestational diabetes mellitus in pregnancy, unspecified control: Secondary | ICD-10-CM

## 2022-06-14 DIAGNOSIS — O99213 Obesity complicating pregnancy, third trimester: Secondary | ICD-10-CM

## 2022-06-17 ENCOUNTER — Inpatient Hospital Stay (HOSPITAL_BASED_OUTPATIENT_CLINIC_OR_DEPARTMENT_OTHER): Payer: Medicaid Other

## 2022-06-17 ENCOUNTER — Inpatient Hospital Stay (HOSPITAL_COMMUNITY)
Admission: AD | Admit: 2022-06-17 | Discharge: 2022-06-17 | Disposition: A | Discharge status: Home / Self Care | Payer: Medicaid Other | Attending: Obstetrics and Gynecology | Admitting: Obstetrics and Gynecology

## 2022-06-17 ENCOUNTER — Encounter (HOSPITAL_COMMUNITY): Payer: Self-pay | Admitting: Obstetrics and Gynecology

## 2022-06-17 ENCOUNTER — Other Ambulatory Visit: Payer: Self-pay

## 2022-06-17 DIAGNOSIS — Z3A33 33 weeks gestation of pregnancy: Secondary | ICD-10-CM | POA: Diagnosis not present

## 2022-06-17 DIAGNOSIS — O368130 Decreased fetal movements, third trimester, not applicable or unspecified: Secondary | ICD-10-CM | POA: Insufficient documentation

## 2022-06-17 DIAGNOSIS — O365930 Maternal care for other known or suspected poor fetal growth, third trimester, not applicable or unspecified: Secondary | ICD-10-CM

## 2022-06-17 DIAGNOSIS — O99213 Obesity complicating pregnancy, third trimester: Secondary | ICD-10-CM | POA: Diagnosis not present

## 2022-06-17 DIAGNOSIS — E669 Obesity, unspecified: Secondary | ICD-10-CM

## 2022-06-17 DIAGNOSIS — O24419 Gestational diabetes mellitus in pregnancy, unspecified control: Secondary | ICD-10-CM | POA: Diagnosis not present

## 2022-06-17 HISTORY — DX: Gestational diabetes mellitus in pregnancy, unspecified control: O24.419

## 2022-06-17 LAB — WET PREP, GENITAL
Clue Cells Wet Prep HPF POC: NONE SEEN
Sperm: NONE SEEN
Trich, Wet Prep: NONE SEEN
WBC, Wet Prep HPF POC: >=10 — AB (ref <10)
Yeast Wet Prep HPF POC: NONE SEEN

## 2022-06-17 NOTE — Progress Notes (Signed)
Patient back from [place]. [place] tech reported BPP 8/8. [name], NP notified and ok with patient staying off of fetal monitors.

## 2022-06-17 NOTE — MAU Provider Note (Signed)
History     [id number]  Arrival date and time: 06/17/22 1516    Chief Complaint  Patient presents with   Decreased Fetal Movement   Vaginal Discharge     HPI Kathryn Doyle is a 22 y.o. at [redacted]w[redacted]d who presents for decreased fetal movement & vaginal discharge.  Reports no fetal movement since yesterday. Has also noticed an increase in thick white malodorous discharge. Denies abdominal pain, dysuria, vaginal bleeding, LOF, vaginal irritation. Is being followed by MFM for FGR.   OB History     Gravida  1   Para  0   Term  0   Preterm  0   AB  0   Living  0      SAB  0   IAB  0   Ectopic  0   Multiple  0   Live Births  0           Past Medical History:  Diagnosis Date   Episodic tension-type headache, not intractable 07/01/2018   Gestational diabetes    Migraine without aura and without status migrainosus, not intractable 07/01/2018    Past Surgical History:  Procedure Laterality Date   CHOLECYSTECTOMY  02/2020    Family History  Problem Relation Age of Onset   Stroke Father    Hypertension Father    Diabetes Father    Hypertension Mother    Diabetes Mother    Leukemia Other     No Known Allergies  No current facility-administered medications on file prior to encounter.   Current Outpatient Medications on File Prior to Encounter  Medication Sig Dispense Refill   calcium carbonate (TUMS - DOSED IN MG ELEMENTAL CALCIUM) 500 MG chewable tablet Chew 1 tablet by mouth daily.     glucose blood (ACCU-CHEK GUIDE) test strip Take blood sugars 4 times a day. Take when fasting before breakfast, after breakfast, after lunch and after dinner. 100 each 12   pantoprazole (PROTONIX) 20 MG tablet Take 1 tablet (20 mg total) by mouth daily. 30 tablet 1   Prenatal Vit-Fe Fumarate-FA (BL PRENATAL VITAMINS PO) Take 1 tablet by mouth daily.     Blood Glucose Monitoring Suppl (ACCU-CHEK GUIDE ME) w/Device KIT 1 Device by Does not apply route in the morning, at  noon, in the evening, and at bedtime. 1 kit 0   Blood Pressure Monitoring (BLOOD PRESSURE KIT) DEVI 1 Device by Does not apply route once a week. (Patient not taking: Reported on 05/04/2022) 1 each 0   Lancets (ACCU-CHEK MULTICLIX) lancets Use as instructed 100 each 12   Misc. Devices (GOJJI WEIGHT SCALE) MISC 1 Device by Does not apply route as needed. 1 each 0   [DISCONTINUED] albuterol (PROVENTIL HFA;VENTOLIN HFA) 108 (90 Base) MCG/ACT inhaler Inhale 2 puffs into the lungs every 6 (six) hours as needed for wheezing or shortness of breath. 1 Inhaler 2     ROS Pertinent positives and negative per HPI, all others reviewed and negative  Physical Exam   BP (!) 111/59   Pulse 90   Temp 98.2 F (36.8 C) (Oral)   Resp 18   Ht 5\' 1"  (1.549 m)   Wt 82.2 kg   LMP 10/09/2021   SpO2 98%   BMI 34.24 kg/m   Patient Vitals for the past 24 hrs:  BP Temp Temp src Pulse Resp SpO2 Height Weight  06/17/22 1541 (!) 111/59 -- -- 90 18 98 % -- --  06/17/22 1533 117/75 98.2 F (36.8 C) Oral  100 18 -- -- --  06/17/22 1523 -- -- -- -- -- -- 5\' 1"  (1.549 m) 82.2 kg    Physical Exam Vitals and nursing note reviewed.  Constitutional:      General: She is not in acute distress.    Appearance: Normal appearance.  HENT:     Head: Normocephalic and atraumatic.  Eyes:     General: No scleral icterus.    Conjunctiva/sclera: Conjunctivae normal.  Pulmonary:     Effort: Pulmonary effort is normal. No respiratory distress.  Skin:    General: Skin is warm and dry.  Neurological:     Mental Status: She is alert.  Psychiatric:        Mood and Affect: Mood normal.        Behavior: Behavior normal.       FHT Baseline 135, moderate variability, 15x15 accels, no decels Toco: irregular Cat: 1  Labs Results for orders placed or performed during the hospital encounter of 06/17/22 (from the past 24 hour(s))  Wet prep, genital     Status: Abnormal   Collection Time: 06/17/22  4:06 PM  Result Value  Ref Range   Yeast Wet Prep HPF POC NONE SEEN NONE SEEN   Trich, Wet Prep NONE SEEN NONE SEEN   Clue Cells Wet Prep HPF POC NONE SEEN NONE SEEN   WBC, Wet Prep HPF POC >=10 (A) <10   Sperm NONE SEEN     Imaging No results found.  MAU Course  Procedures Lab Orders         Wet prep, genital    No orders of the defined types were placed in this encounter.  Imaging Orders         [place] MFM FETAL BPP WO NON STRESS      MDM Wet prep negative. GC/CT pending.   Reactive fetal tracing. Patient continued to report no fetal movement. BPP ordered. BPP 8/8 with normal AFI Assessment and Plan   1. Decreased fetal movements in third trimester, single or unspecified fetus   2. [redacted] weeks gestation of pregnancy    -Reviewed reasons to return to MAU -Patient has BPP & MFM f/u on Wednesday   [name], NP 06/17/22 4:59 PM

## 2022-06-17 NOTE — MAU Note (Signed)
.  Kathryn Doyle is a 22 y.o. at [redacted]w[redacted]d here in MAU reporting: DFM since yesterday afternoon. States she does not remember the last time she felt the baby move. States she tried lying down and drinking cold water and it did nothing. Denies any ctx, LOF, or VB. States she has white vaginal discharge that has been on and off that has an odor. Denies any vaginal pain or itching.  Gestational diabetes-diet controlled fasting glucose this morning was 79  States baby is measuring small.  Onset of complaint: yesterday  Pain score: 0 Vitals:   06/17/22 1533  BP: 117/75  Pulse: 100     FHT:135 Lab orders placed from triage:  n/a

## 2022-06-18 LAB — GC/CHLAMYDIA PROBE AMP (~~LOC~~) NOT AT ARMC
Chlamydia: NEGATIVE
Comment: NEGATIVE
Comment: NORMAL
Neisseria Gonorrhea: NEGATIVE

## 2022-06-20 ENCOUNTER — Ambulatory Visit: Payer: Medicaid Other

## 2022-06-20 ENCOUNTER — Inpatient Hospital Stay (HOSPITAL_COMMUNITY)
Admission: AD | Admit: 2022-06-20 | Discharge: 2022-06-20 | Disposition: A | Discharge status: Home / Self Care | Payer: Medicaid Other | Attending: Obstetrics & Gynecology | Admitting: Obstetrics & Gynecology

## 2022-06-20 ENCOUNTER — Inpatient Hospital Stay (HOSPITAL_BASED_OUTPATIENT_CLINIC_OR_DEPARTMENT_OTHER): Payer: Medicaid Other

## 2022-06-20 ENCOUNTER — Inpatient Hospital Stay (HOSPITAL_COMMUNITY): Payer: Medicaid Other

## 2022-06-20 ENCOUNTER — Encounter (HOSPITAL_COMMUNITY): Payer: Self-pay | Admitting: Obstetrics & Gynecology

## 2022-06-20 DIAGNOSIS — O99213 Obesity complicating pregnancy, third trimester: Secondary | ICD-10-CM

## 2022-06-20 DIAGNOSIS — O368130 Decreased fetal movements, third trimester, not applicable or unspecified: Secondary | ICD-10-CM | POA: Insufficient documentation

## 2022-06-20 DIAGNOSIS — O212 Late vomiting of pregnancy: Secondary | ICD-10-CM | POA: Insufficient documentation

## 2022-06-20 DIAGNOSIS — Z3400 Encounter for supervision of normal first pregnancy, unspecified trimester: Secondary | ICD-10-CM

## 2022-06-20 DIAGNOSIS — N83209 Unspecified ovarian cyst, unspecified side: Secondary | ICD-10-CM

## 2022-06-20 DIAGNOSIS — O99891 Other specified diseases and conditions complicating pregnancy: Secondary | ICD-10-CM | POA: Diagnosis present

## 2022-06-20 DIAGNOSIS — R109 Unspecified abdominal pain: Secondary | ICD-10-CM

## 2022-06-20 DIAGNOSIS — Z3A33 33 weeks gestation of pregnancy: Secondary | ICD-10-CM

## 2022-06-20 DIAGNOSIS — O24419 Gestational diabetes mellitus in pregnancy, unspecified control: Secondary | ICD-10-CM

## 2022-06-20 DIAGNOSIS — O26893 Other specified pregnancy related conditions, third trimester: Secondary | ICD-10-CM

## 2022-06-20 DIAGNOSIS — E669 Obesity, unspecified: Secondary | ICD-10-CM

## 2022-06-20 DIAGNOSIS — O4703 False labor before 37 completed weeks of gestation, third trimester: Secondary | ICD-10-CM

## 2022-06-20 DIAGNOSIS — O365930 Maternal care for other known or suspected poor fetal growth, third trimester, not applicable or unspecified: Secondary | ICD-10-CM | POA: Diagnosis not present

## 2022-06-20 DIAGNOSIS — O24410 Gestational diabetes mellitus in pregnancy, diet controlled: Secondary | ICD-10-CM | POA: Insufficient documentation

## 2022-06-20 DIAGNOSIS — O3483 Maternal care for other abnormalities of pelvic organs, third trimester: Secondary | ICD-10-CM

## 2022-06-20 DIAGNOSIS — O0993 Supervision of high risk pregnancy, unspecified, third trimester: Secondary | ICD-10-CM | POA: Diagnosis not present

## 2022-06-20 DIAGNOSIS — R1032 Left lower quadrant pain: Secondary | ICD-10-CM | POA: Diagnosis not present

## 2022-06-20 DIAGNOSIS — Z3689 Encounter for other specified antenatal screening: Secondary | ICD-10-CM

## 2022-06-20 LAB — URINALYSIS, ROUTINE W REFLEX MICROSCOPIC
Bilirubin Urine: NEGATIVE
Glucose, UA: NEGATIVE mg/dL
Hgb urine dipstick: NEGATIVE
Ketones, ur: 5 mg/dL — AB
Nitrite: NEGATIVE
Protein, ur: 30 mg/dL — AB
Specific Gravity, Urine: 1.025 (ref 1.005–1.030)
pH: 5 (ref 5.0–8.0)

## 2022-06-20 LAB — CBC WITH DIFFERENTIAL/PLATELET
Abs Immature Granulocytes: 0.07 10*3/uL (ref 0.00–0.07)
Basophils Absolute: 0 10*3/uL (ref 0.0–0.1)
Basophils Relative: 0 %
Eosinophils Absolute: 0 10*3/uL (ref 0.0–0.5)
Eosinophils Relative: 0 %
HCT: 37 % (ref 36.0–46.0)
Hemoglobin: 13 g/dL (ref 12.0–15.0)
Immature Granulocytes: 1 %
Lymphocytes Relative: 11 %
Lymphs Abs: 1.2 10*3/uL (ref 0.7–4.0)
MCH: 31.6 pg (ref 26.0–34.0)
MCHC: 35.1 g/dL (ref 30.0–36.0)
MCV: 89.8 fL (ref 80.0–100.0)
Monocytes Absolute: 0.6 10*3/uL (ref 0.1–1.0)
Monocytes Relative: 5 %
Neutro Abs: 9.6 10*3/uL — ABNORMAL HIGH (ref 1.7–7.7)
Neutrophils Relative %: 83 %
Platelets: 308 10*3/uL (ref 150–400)
RBC: 4.12 MIL/uL (ref 3.87–5.11)
RDW: 14 % (ref 11.5–15.5)
WBC: 11.6 10*3/uL — ABNORMAL HIGH (ref 4.0–10.5)
nRBC: 0 % (ref 0.0–0.2)

## 2022-06-20 LAB — COMPREHENSIVE METABOLIC PANEL
ALT: 40 U/L (ref 0–44)
AST: 26 U/L (ref 15–41)
Albumin: 2.7 g/dL — ABNORMAL LOW (ref 3.5–5.0)
Alkaline Phosphatase: 107 U/L (ref 38–126)
Anion gap: 11 (ref 5–15)
BUN: 7 mg/dL (ref 6–20)
CO2: 22 mmol/L (ref 22–32)
Calcium: 8.6 mg/dL — ABNORMAL LOW (ref 8.9–10.3)
Chloride: 101 mmol/L (ref 98–111)
Creatinine, Ser: 0.55 mg/dL (ref 0.44–1.00)
GFR, Estimated: >60 mL/min (ref >60)
Glucose, Bld: 101 mg/dL — ABNORMAL HIGH (ref 70–99)
Potassium: 3.8 mmol/L (ref 3.5–5.1)
Sodium: 134 mmol/L — ABNORMAL LOW (ref 135–145)
Total Bilirubin: 0.3 mg/dL (ref 0.3–1.2)
Total Protein: 6.8 g/dL (ref 6.5–8.1)

## 2022-06-20 LAB — AMYLASE: Amylase: 81 U/L (ref 28–100)

## 2022-06-20 LAB — LIPASE, BLOOD: Lipase: 35 U/L (ref 11–51)

## 2022-06-20 MED ORDER — HYDROMORPHONE HCL 1 MG/ML IJ SOLN
1.0000 mg | INTRAMUSCULAR | Status: DC | PRN
  Administered 2022-06-20: 1 mg via INTRAMUSCULAR
  Filled 2022-06-20: qty 1

## 2022-06-20 MED ORDER — ONDANSETRON 4 MG PO TBDP: 4.0000 mg | ORAL_TABLET | Freq: Four times a day (QID) | ORAL | 0 refills | Status: DC | PRN

## 2022-06-20 MED ORDER — ACETAMINOPHEN 500 MG PO TABS: 1000.0000 mg | ORAL_TABLET | Freq: Four times a day (QID) | ORAL | 1 refills | Status: DC | PRN

## 2022-06-20 MED ORDER — DOCUSATE SODIUM 100 MG PO CAPS: 100.0000 mg | ORAL_CAPSULE | Freq: Two times a day (BID) | ORAL | 2 refills | Status: DC | PRN

## 2022-06-20 MED ORDER — OXYCODONE HCL 5 MG PO TABS: 5.0000 mg | ORAL_TABLET | ORAL | 0 refills | Status: DC | PRN

## 2022-06-20 NOTE — MAU Note (Signed)
...  Kathryn Doyle is a 22 y.o. at [redacted]w[redacted]d here in MAU reporting: Occasional CTX accompanied by constant lower abdominal pain since 1140 this morning. She reports the constant pain is in the middle of her lower abdomen and radiates to her left side. She reports this pain is sharp. She reports she is feeling severe amounts of vaginal pressure. Denies VB or LOF. +FM.   Onset of complaint: 1140 Pain score:  10/10 CTX 10/10 left mid to lower abdomen   FHT: 135 initial external Lab orders placed from triage:  UA

## 2022-06-20 NOTE — MAU Provider Note (Signed)
Obstetric Attending MAU Note  Chief Complaint:  Contractions, Abdominal Pain, and Flank Pain   Event Date/Time   First Provider Initiated Contact with Patient 06/20/22 1257     HPI: Kathryn Doyle is a 22 y.o. G1P0000 at 6665w5d who presents to maternity admissions reporting constant left sided lower abdominal pain since 1140 this morning, radiating to left side and back.  Also reports having urinary odor, no dysuria.  Has known 7 cm left ovarian simple cyst, last visualized on 06/17/22 scan.  Also had episode of emesis this morning, but able to tolerate oral intake. Also thinks she may be having irregular contractions and vaginal pressure.  Denies any abnormal vaginal discharge, fevers, chills, sweats, other GI or GU symptoms or other general symptoms. Denies  leakage of fluid or vaginal bleeding. Good fetal movement.   P[facility] Course: [facility] Patient Active Problem List   Diagnosis Date Noted   Ovarian cyst 06/20/2022   Fetal growth restriction antepartum 06/13/2022   Gestational diabetes mellitus 05/04/2022   Obesity in pregnancy 04/11/2022   Supervision of high risk pregnancy in third trimester 03/07/2022    Past Medical History:  Diagnosis Date   Episodic tension-type headache, not intractable 07/01/2018   Gestational diabetes    Migraine without aura and without status migrainosus, not intractable 07/01/2018    OB History  Gravida Para Term Preterm AB Living  1 0 0 0 0 0  SAB IAB Ectopic Multiple Live Births  0 0 0 0 0    # Outcome Date GA Lbr Len/2nd Weight Sex Delivery Anes PTL Lv  1 Current             Past Surgical History:  Procedure Laterality Date   CHOLECYSTECTOMY  02/2020    Family History: Family History  Problem Relation Age of Onset   Stroke Father    Hypertension Father    Diabetes Father    Hypertension Mother    Diabetes Mother    Leukemia Other     Social History: Social History   Tobacco Use   Smoking status: Never    Smokeless tobacco: Never  Vaping Use   Vaping Use: Never used  Substance Use Topics   Alcohol use: No    Alcohol/week: 0.0 standard drinks of alcohol   Drug use: No    Allergies: No Known Allergies  Medications Prior to Admission  Medication Sig Dispense Refill Last Dose   Blood Glucose Monitoring Suppl (ACCU-CHEK GUIDE ME) w/Device KIT 1 Device by Does not apply route in the morning, at noon, in the evening, and at bedtime. 1 kit 0    Blood Pressure Monitoring (BLOOD PRESSURE KIT) DEVI 1 Device by Does not apply route once a week. (Patient not taking: Reported on 05/04/2022) 1 each 0    calcium carbonate (TUMS - DOSED IN MG ELEMENTAL CALCIUM) 500 MG chewable tablet Chew 1 tablet by mouth daily.      glucose blood (ACCU-CHEK GUIDE) test strip Take blood sugars 4 times a day. Take when fasting before breakfast, after breakfast, after lunch and after dinner. 100 each 12    Lancets (ACCU-CHEK MULTICLIX) lancets Use as instructed 100 each 12    Misc. Devices (GOJJI WEIGHT SCALE) MISC 1 Device by Does not apply route as needed. 1 each 0    pantoprazole (PROTONIX) 20 MG tablet Take 1 tablet (20 mg total) by mouth daily. 30 tablet 1    Prenatal Vit-Fe Fumarate-FA (BL PRENATAL VITAMINS PO) Take 1 tablet by  mouth daily.       ROS: Pertinent findings in history of present illness.  Physical Exam  Blood pressure 113/79, pulse 82, temperature 97.9 F (36.6 C), temperature source Oral, resp. rate 17, height 5\' 1"  (1.549 m), last menstrual period 10/09/2021, SpO2 100 %. CONSTITUTIONAL: Well-developed, well-nourished female in no acute distress.  HENT:  Normocephalic, atraumatic, External right and left ear normal. Oropharynx is clear and moist EYES: Conjunctivae and EOM are normal. Pupils are equal, round, and reactive to light. No scleral icterus.  NECK: Normal range of motion, supple, no masses SKIN: Skin is warm and dry. No rash noted. Not diaphoretic. No erythema. No pallor. NEUROLGIC: Alert  and oriented to person, place, and time. Normal reflexes, muscle tone coordination. No cranial nerve deficit noted. PSYCHIATRIC: Normal mood and affect. Normal behavior. Normal judgment and thought content. CARDIOVASCULAR: Normal heart rate noted, regular rhythm RESPIRATORY: Effort and breath sounds normal, no problems with respiration noted ABDOMEN: Soft, moderate LLQ/left flank pain, no rebound or guarding, nondistended, gravid appropriate for gestational age MUSCULOSKELETAL: Normal range of motion. No edema and no tenderness. 2+ distal pulses.  SPECULUM EXAM: NEFG, physiologic discharge, no blood, cervix clean Dilation: Closed Effacement (%): Thick Exam by:: Dr. Macon Large  FHT:  Baseline 30 , moderate variability, accelerations present, no decelerations Contractions: None   Labs: Results for orders placed or performed during the hospital encounter of 06/20/22 (from the past 24 hour(s))  Urinalysis, Routine w reflex microscopic -Urine, Clean Catch     Status: Abnormal   Collection Time: 06/20/22 12:45 PM  Result Value Ref Range   Color, Urine YELLOW YELLOW   APPearance TURBID (A) CLEAR   Specific Gravity, Urine 1.025 1.005 - 1.030   pH 5.0 5.0 - 8.0   Glucose, UA NEGATIVE NEGATIVE mg/dL   Hgb urine dipstick NEGATIVE NEGATIVE   Bilirubin Urine NEGATIVE NEGATIVE   Ketones, ur 5 (A) NEGATIVE mg/dL   Protein, ur 30 (A) NEGATIVE mg/dL   Nitrite NEGATIVE NEGATIVE   Leukocytes,Ua SMALL (A) NEGATIVE   RBC / HPF 11-20 0 - 5 RBC/hpf   WBC, UA 21-50 0 - 5 WBC/hpf   Bacteria, UA MANY (A) NONE SEEN   Squamous Epithelial / HPF 21-50 0 - 5 /HPF   Mucus PRESENT   CBC with Differential/Platelet     Status: Abnormal   Collection Time: 06/20/22  1:22 PM  Result Value Ref Range   WBC 11.6 (H) 4.0 - 10.5 K/uL   RBC 4.12 3.87 - 5.11 MIL/uL   Hemoglobin 13.0 12.0 - 15.0 g/dL   HCT 16.1 09.6 - 04.5 %   MCV 89.8 80.0 - 100.0 fL   MCH 31.6 26.0 - 34.0 pg   MCHC 35.1 30.0 - 36.0 g/dL   RDW 40.9  81.1 - 91.4 %   Platelets 308 150 - 400 K/uL   nRBC 0.0 0.0 - 0.2 %   Neutrophils Relative % 83 %   Neutro Abs 9.6 (H) 1.7 - 7.7 K/uL   Lymphocytes Relative 11 %   Lymphs Abs 1.2 0.7 - 4.0 K/uL   Monocytes Relative 5 %   Monocytes Absolute 0.6 0.1 - 1.0 K/uL   Eosinophils Relative 0 %   Eosinophils Absolute 0.0 0.0 - 0.5 K/uL   Basophils Relative 0 %   Basophils Absolute 0.0 0.0 - 0.1 K/uL   Immature Granulocytes 1 %   Abs Immature Granulocytes 0.07 0.00 - 0.07 K/uL    Imaging:  US Renal  Result Date: 06/20/2022 CLINICAL  DATA:  Left flank pain EXAM: RENAL / URINARY TRACT ULTRASOUND COMPLETE COMPARISON:  01/02/2005 FINDINGS: Right Kidney: Renal measurements: 8.8 x 4.7 x 5.0 cm = volume: 106 mL. Echogenicity within normal limits. No mass or hydronephrosis visualized. Left Kidney: Renal measurements: 10.1 x 5.1 x 4.0 cm = volume: 106 mL. Echogenicity within normal limits. No mass or hydronephrosis visualized. Bladder: Decompressed. Other: None. IMPRESSION: No evidence of obstructive uropathy. Electronically Signed   By: Duanne Guess D.O.   On: 06/20/2022 14:21   [place] MFM FETAL BPP WO NON STRESS  Result Date: 06/18/2022 ----------------------------------------------------------------------  OBSTETRICS REPORT                       (Signed Final 06/18/2022 03:10 pm) ---------------------------------------------------------------------- Patient Info  ID #:       [id number]                          D.O.B.:  15-Sep-2000 (21 yrs)  Name:       Kathryn Doyle                  Visit Date: 06/17/2022 04:26 pm ---------------------------------------------------------------------- Performed By  Attending:        Braxton Feathers DO       Ref. Address:     [address]                                                             [address]                                                             [place]                                                              [address]  Performed By:     Hurman Horn          [facility]  Referred By:      [facility] ---------------------------------------------------------------------- Orders  #  Description  Code        Ordered By  1  US MFM FETAL BPP WO NON               [id number]    ERIN LAWRENCE     STRESS ----------------------------------------------------------------------  #  Order #                     Accession #                Episode #  1  [id number]                   [id number]                 [id number] ---------------------------------------------------------------------- Indications  Decreased fetal movement                       O36.8190  Gestational diabetes in pregnancy,             O24.419  unspecified control  Obesity complicating pregnancy (BMI 30)        O99.210 E66.9  Fetal growth restriction                       O36.5930  [redacted] weeks gestation of pregnancy                Z3A.33 ---------------------------------------------------------------------- Fetal Evaluation  Num Of Fetuses:         1  Fetal Heart Rate(bpm):  128  Cardiac Activity:       Observed  Presentation:           Cephalic  Placenta:               Anterior  P. Cord Insertion:      Visualized, central  Amniotic Fluid  AFI FV:      Within normal limits  AFI Sum(cm)     %Tile       Largest Pocket(cm)  12.5            37          4.6  RUQ(cm)       RLQ(cm)       LUQ(cm)        LLQ(cm)  3.7           2.1           4.6            2.1 ---------------------------------------------------------------------- Biophysical Evaluation  Amniotic F.V:   Within normal limits       F. Tone:        Observed  F. Movement:    Observed                   Score:          8/8  F. Breathing:   Observed ---------------------------------------------------------------------- OB History  Gravidity:    1         Term:   0        Prem:   0        SAB:    0  TOP:          0       Ectopic:  0        Living: 0 ---------------------------------------------------------------------- Gestational Age  LMP:           35w 6d        Date:  10/09/21  EDD:   07/16/22  Best:          33w 2d     Det. ByMarcella Dubs         EDD:   08/03/22                                      (02/07/22) ---------------------------------------------------------------------- Cervix Uterus Adnexa  Cervix  Not visualized (advanced GA >24wks)  Uterus  No abnormality visualized.  Right Ovary  Within normal limits.  Left Ovary  Simple cyst measuring. (7.0 x 5.2 x 5.4cm) ---------------------------------------------------------------------- Comments  Hospital Ultrasound  33w 2d at the MAU for decreased fetal movement. EDD:  08/03/2022 by Early Ultrasound  (02/07/22).  Sonographic findings  Single intrauterine pregnancy.  Fetal cardiac activity: Observed and appears normal.  Presentation: Cephalic.  Limited fetal anatomy appears normal.  Amniotic fluid volume: Within normal limits. AFI: 12.5 cm.  MVP: 4.6 cm.  Placenta: Anterior. There is no sonographic evidence of  bleeding.  BPP: 8/8.  The left ovary has a large (7cm) simple cyst.  Recommendations  - Continue clinical management per OB provider  - F/u ultrasound  This was a limited ultrasound with a remote read. If an official  MFM consult is requested for any reason please call/place an  order in Epic. ----------------------------------------------------------------------                 Braxton Feathers, DO Electronically Signed Final Report   06/18/2022 03:10 pm ----------------------------------------------------------------------  [place] MFM OB FOLLOW UP  Result Date: 06/13/2022 ----------------------------------------------------------------------  OBSTETRICS REPORT                       (Signed Final 06/13/2022 04:53 pm) ---------------------------------------------------------------------- Patient Info  ID #:       [id number]                           D.O.B.:  13-Dec-2000 (21 yrs)  Name:       Kathryn Doyle                  Visit Date: 06/13/2022 04:20 pm ---------------------------------------------------------------------- Performed By  Attending:        Braxton Feathers DO       Ref. Address:     [address]                                                             [phone]  [place]                                                             [address]  Performed By:     Anabel Halon          [facility]  Referred By:      [facility] ---------------------------------------------------------------------- Orders  #  Description                           Code        Ordered By  1  [place] MFM OB FOLLOW UP                   76816.01    JESSICA EMLY  2  [place] MFM FETAL BPP WO NON               76819.01    JESSICA EMLY     STRESS  3  [place] MFM UA CORD DOPPLER                76820.02    JESSICA EMLY ----------------------------------------------------------------------  #  Order #                     Accession #                Episode #  1  [id number]                   [id number]                 [id number]  2  [id number]                   [id number]                 [id number]  3  [id number]                   [id number]                 [id number] ---------------------------------------------------------------------- Indications  Gestational diabetes in pregnancy,  O24.419  unspecified control  Obesity complicating pregnancy (BMI 30)        O99.210 E66.9  [redacted] weeks gestation of pregnancy                Z3A.32  Encounter for other antenatal screening        Z36.2  follow-up  LR NIPS/Neg Horizon/Neg AFP   Fetal growth restriction                       O36.5930 ---------------------------------------------------------------------- Fetal Evaluation  Num Of Fetuses:         1  Fetal Heart Rate(bpm):  141  Cardiac Activity:       Observed  Presentation:           Cephalic  Placenta:               Anterior  P. Cord Insertion:      Previously visualized  Amniotic Fluid  AFI FV:      Within normal limits  AFI Sum(cm)     %Tile       Largest Pocket(cm)  11.1            26          4.  RUQ(cm)       RLQ(cm)       LUQ(cm)        LLQ(cm)  3.7           4             3.4            0 ---------------------------------------------------------------------- Biophysical Evaluation  Amniotic F.V:   Pocket => 2 cm             F. Tone:        Observed  F. Movement:    Observed                   Score:          8/8  F. Breathing:   Observed ---------------------------------------------------------------------- Biometry  BPD:      81.1  mm     G. Age:  32w 4d         39  %    CI:        81.27   %    70 - 86                                                          FL/HC:      20.4   %    19.9 - 21.5  HC:       284   mm     G. Age:  31w 1d        1.3  %    HC/AC:      1.03        0.96 - 1.11  AC:      275.1  mm     G. Age:  31w 4d         19  %    FL/BPD:     71.5   %    71 - 87  FL:         58  mm     G. Age:  30w 2d        1.9  %    FL/AC:      21.1   %    20 - 24  Est. FW:    1727  gm    3 lb 13 oz       8  % ---------------------------------------------------------------------- OB History  Gravidity:    1         Term:   0        Prem:   0        SAB:   0  TOP:          0       Ectopic:  0        Living: 0 ---------------------------------------------------------------------- Gestational Age  LMP:           35w 2d        Date:  10/09/21                  EDD:   07/16/22  U/S Today:     31w 3d                                        EDD:   08/12/22  Best:          32w 5d     Det. ByLoman Chroman         EDD:   08/03/22                                       (02/07/22) ---------------------------------------------------------------------- Anatomy  Cranium:               Appears normal         LVOT:                   Previously seen  Cavum:                 Previously             Aortic Arch:            Previously seen                         visualized  Ventricles:            Previously seen        Ductal Arch:            Previously seen  Choroid Plexus:        Previously seen        Diaphragm:              Appears normal  Cerebellum:            Previously seen        Stomach:                Appears normal, left  sided  Posterior Fossa:       Previously seen        Abdomen:                Previously seen  Nuchal Fold:           Not applicable (>20    Abdominal Wall:         Previously seen                         wks GA)  Face:                  Orbits and profile     Cord Vessels:           Previously seen                         previously seen  Lips:                  Previously seen        Kidneys:                Appear normal  Palate:                Previously             Bladder:                Appears normal                         visualized  Thoracic:              Previously seen        Spine:                  Previously seen  Heart:                 Appears normal;        Upper Extremities:      Previously seen                         Left EIF prev seen  RVOT:                  Previously seen        Lower Extremities:      Previously seen  Other:  Female gender previously seen. Heels prev visualized. Open hands          prev visualized. Nasal bone prev visualized. 3VV prev visualized. ---------------------------------------------------------------------- Doppler - Fetal Vessels  Umbilical Artery   S/D     %tile      RI    %tile      PI    %tile            ADFV    RDFV    3.1       77     0.7       86     1.1       85               No      No  ---------------------------------------------------------------------- Cervix Uterus Adnexa  Cervix  Not visualized (advanced GA >24wks)  Uterus  No abnormality visualized.  Right Ovary  Not visualized.  Left Ovary  Simple cyst measuring 7x5x6 cm  Cul De Sac  No free fluid seen.  Adnexa  No abnormality visualized ---------------------------------------------------------------------- Comments  The patient is here for a follow-up ultrasound for gDM and  new onset FGR  at 32w 5d. EDD: 08/03/2022. Dating: Early  Ultrasound  (02/07/22). She has no concerns today and  reports most of her blood sugars are well controlled.  Sonographic findings  Single intrauterine pregnancy.  Fetal cardiac activity:  Observed and appears normal.  Presentation: Cephalic.  Interval fetal anatomy appears normal.  Fetal biometry shows the estimated fetal weight at the 8  percentile and the abdominal circumference at the 19  percentile.  Amniotic fluid volume: Within normal limits. AFI: 11.1 cm.  MVP: 4. cm.  Placenta: Anterior.  Umbilical artery dopplers findings:  -S/D:3.1 which are normal at this gestational age.  -Absent end-diastolic flow: No.  -Reversed end-diastolic flow:  No.  BPP 8/8.  Recommendations  I discussed the new diagnosis and potential causes and  clinical prognosis of FGR.  - Continue weekly UA dopplers and BPP until delivery.  - Serial growth [place] every 3 weeks until delivery.  - Delivery likely around 37 weeks or sooner if indicated.  - I encouraged her to bring her blood sugar log and  expressed the risks such as fetal hypoxia and stillbirth in the  setting of FGR and diabetes ----------------------------------------------------------------------                  Braxton Feathers, DO Electronically Signed Final Report   06/13/2022 04:53 pm ----------------------------------------------------------------------  [place] MFM FETAL BPP WO NON STRESS  Result Date:  06/13/2022 ----------------------------------------------------------------------  OBSTETRICS REPORT                       (Signed Final 06/13/2022 04:53 pm) ---------------------------------------------------------------------- Patient Info  ID #:       [id number]                          D.O.B.:  [birth year] (21 yrs)  Name:       Kathryn Doyle                  Visit Date: 06/13/2022 04:20 pm ---------------------------------------------------------------------- Performed By  Attending:        Braxton Feathers DO       Ref. Address:     [address]                                                             [address]                                                             [place]  [address]  Performed By:     Anabel Halon          [facility]  Referred By:      [facility] ---------------------------------------------------------------------- Orders  #  Description                           Code        Ordered By  1  [place] MFM OB FOLLOW UP                   76816.01    JESSICA EMLY  2  [place] MFM FETAL BPP WO NON               76819.01    JESSICA EMLY     STRESS  3  [place] MFM UA CORD DOPPLER                76820.02    JESSICA EMLY ----------------------------------------------------------------------  #  Order #                     Accession #                Episode #  1  [id number]                   [id number]                 [id number]  2  [id number]                   [id number]                 [id number]  3  [id number]                   [id number]                 [id number] ---------------------------------------------------------------------- Indications  Gestational  diabetes in pregnancy,             O24.419  unspecified control  Obesity complicating pregnancy (BMI 30)        O99.210 E66.9  [redacted] weeks gestation of pregnancy                Z3A.32  Encounter for other antenatal screening        Z36.2  follow-up  LR NIPS/Neg Horizon/Neg AFP  Fetal growth restriction                       O36.5930 ---------------------------------------------------------------------- Fetal Evaluation  Num Of Fetuses:         1  Fetal Heart Rate(bpm):  141  Cardiac Activity:       Observed  Presentation:           Cephalic  Placenta:               Anterior  P. Cord Insertion:      Previously visualized  Amniotic Fluid  AFI FV:      Within normal limits  AFI Sum(cm)     %Tile       Largest Pocket(cm)  11.1            26          4.  RUQ(cm)       RLQ(cm)       LUQ(cm)        LLQ(cm)  3.7           4             3.4            0 ---------------------------------------------------------------------- Biophysical Evaluation  Amniotic F.V:   Pocket => 2 cm             F. Tone:        Observed  F. Movement:    Observed                   Score:          8/8  F. Breathing:   Observed ---------------------------------------------------------------------- Biometry  BPD:      81.1  mm     G. Age:  32w 4d         39  %    CI:        81.27   %    70 - 86                                                          FL/HC:      20.4   %    19.9 - 21.5  HC:       284   mm     G. Age:  31w 1d        1.3  %    HC/AC:      1.03        0.96 - 1.11  AC:      275.1  mm     G. Age:  31w 4d         19  %    FL/BPD:     71.5   %    71 - 87  FL:         58  mm     G. Age:  30w 2d        1.9  %    FL/AC:      21.1   %    20 - 24  Est. FW:    1727  gm    3 lb 13 oz       8  % ----------------------------------------------------------------------  OB History  Gravidity:    1         Term:   0        Prem:   0        SAB:   0  TOP:          0       Ectopic:  0        Living: 0  ---------------------------------------------------------------------- Gestational Age  LMP:           35w 2d        Date:  10/09/21                  EDD:   07/16/22  U/S Today:     31w 3d                                        EDD:   08/12/22  Best:          32w 5d     Det. ByMarcella Dubs         EDD:   08/03/22                                      (02/07/22) ---------------------------------------------------------------------- Anatomy  Cranium:               Appears normal         LVOT:                   Previously seen  Cavum:                 Previously             Aortic Arch:            Previously seen                         visualized  Ventricles:            Previously seen        Ductal Arch:            Previously seen  Choroid Plexus:        Previously seen        Diaphragm:              Appears normal  Cerebellum:            Previously seen        Stomach:                Appears normal, left                                                                        sided  Posterior Fossa:       Previously seen        Abdomen:                Previously seen  Nuchal Fold:           Not  applicable (>20    Abdominal Wall:         Previously seen                         wks GA)  Face:                  Orbits and profile     Cord Vessels:           Previously seen                         previously seen  Lips:                  Previously seen        Kidneys:                Appear normal  Palate:                Previously             Bladder:                Appears normal                         visualized  Thoracic:              Previously seen        Spine:                  Previously seen  Heart:                 Appears normal;        Upper Extremities:      Previously seen                         Left EIF prev seen  RVOT:                  Previously seen        Lower Extremities:      Previously seen  Other:  Female gender previously seen. Heels prev visualized. Open hands          prev visualized. Nasal bone  prev visualized. 3VV prev visualized. ---------------------------------------------------------------------- Doppler - Fetal Vessels  Umbilical Artery   S/D     %tile      RI    %tile      PI    %tile            ADFV    RDFV    3.1       77     0.7       86     1.1       85               No      No ---------------------------------------------------------------------- Cervix Uterus Adnexa  Cervix  Not visualized (advanced GA >24wks)  Uterus  No abnormality visualized.  Right Ovary  Not visualized.  Left Ovary  Simple cyst measuring 7x5x6 cm  Cul De Sac  No free fluid seen.  Adnexa  No abnormality visualized ---------------------------------------------------------------------- Comments  The patient is here for a follow-up ultrasound for gDM and  new onset FGR  at 32w 5d. EDD: 08/03/2022. Dating: Early  Ultrasound  (02/07/22). She has no concerns today and  reports most of  her blood sugars are well controlled.  Sonographic findings  Single intrauterine pregnancy.  Fetal cardiac activity:  Observed and appears normal.  Presentation: Cephalic.  Interval fetal anatomy appears normal.  Fetal biometry shows the estimated fetal weight at the 8  percentile and the abdominal circumference at the 19  percentile.  Amniotic fluid volume: Within normal limits. AFI: 11.1 cm.  MVP: 4. cm.  Placenta: Anterior.  Umbilical artery dopplers findings:  -S/D:3.1 which are normal at this gestational age.  -Absent end-diastolic flow: No.  -Reversed end-diastolic flow:  No.  BPP 8/8.  Recommendations  I discussed the new diagnosis and potential causes and  clinical prognosis of FGR.  - Continue weekly UA dopplers and BPP until delivery.  - Serial growth [place] every 3 weeks until delivery.  - Delivery likely around 37 weeks or sooner if indicated.  - I encouraged her to bring her blood sugar log and  expressed the risks such as fetal hypoxia and stillbirth in the  setting of FGR and diabetes  ----------------------------------------------------------------------                  Braxton Feathers, DO Electronically Signed Final Report   06/13/2022 04:53 pm ----------------------------------------------------------------------  [place] MFM UA CORD DOPPLER  Result Date: 06/13/2022 ----------------------------------------------------------------------  OBSTETRICS REPORT                       (Signed Final 06/13/2022 04:53 pm) ---------------------------------------------------------------------- Patient Info  ID #:       [id number]                          D.O.B.:  [birth year] (21 yrs)  Name:       Kathryn Doyle                  Visit Date: 06/13/2022 04:20 pm ---------------------------------------------------------------------- Performed By  Attending:        Braxton Feathers DO       Ref. Address:     [address]                                                             [address]                                                             [place]                                                             [address]  Performed By:     Anabel Halon          [facility]  Referred By:      [facility] ---------------------------------------------------------------------- Orders  #  Description                           Code        Ordered By  1  [place] MFM OB FOLLOW UP                   76816.01    JESSICA EMLY  2  [place] MFM FETAL BPP WO NON               76819.01    JESSICA EMLY     STRESS  3  [place] MFM UA CORD DOPPLER                76820.02    JESSICA EMLY ----------------------------------------------------------------------  #  Order #                     Accession #                Episode #  1  [id number]                    [id number]                 [id number]  2  [id number]                   [id number]                 [id number]  3  [id number]                   [id number]                 [id number] ---------------------------------------------------------------------- Indications  Gestational diabetes in pregnancy,             O24.419  unspecified control  Obesity complicating pregnancy (BMI 30)        O99.210 E66.9  [redacted] weeks gestation of pregnancy                Z3A.32  Encounter for other antenatal screening        Z36.2  follow-up  LR NIPS/Neg Horizon/Neg AFP  Fetal growth restriction                       O36.5930 ---------------------------------------------------------------------- Fetal Evaluation  Num Of Fetuses:         1  Fetal Heart Rate(bpm):  141  Cardiac Activity:       Observed  Presentation:           Cephalic  Placenta:               Anterior  P. Cord Insertion:      Previously visualized  Amniotic Fluid  AFI FV:      Within normal limits  AFI Sum(cm)     %Tile       Largest Pocket(cm)  11.1            26          4.  RUQ(cm)       RLQ(cm)       LUQ(cm)        LLQ(cm)  3.7           4             3.4            0 ---------------------------------------------------------------------- Biophysical Evaluation  Amniotic F.V:   Pocket => 2 cm             F. Tone:        Observed  F. Movement:    Observed                   Score:          8/8  F. Breathing:   Observed ---------------------------------------------------------------------- Biometry  BPD:      81.1  mm     G. Age:  32w 4d         39  %    CI:        81.27   %    70 - 86                                                          FL/HC:      20.4   %    19.9 - 21.5  HC:       284   mm     G. Age:  31w 1d        1.3  %    HC/AC:      1.03        0.96 - 1.11  AC:      275.1  mm     G. Age:  31w 4d         19  %    FL/BPD:     71.5   %    71 - 87  FL:         58  mm     G. Age:  30w 2d        1.9  %    FL/AC:      21.1   %    20 - 24  Est. FW:    1727  gm    3 lb 13 oz        8  % ---------------------------------------------------------------------- OB  History  Gravidity:    1         Term:   0        Prem:   0        SAB:   0  TOP:          0       Ectopic:  0        Living: 0 ---------------------------------------------------------------------- Gestational Age  LMP:           35w 2d        Date:  10/09/21                  EDD:   07/16/22  U/S Today:     31w 3d                                        EDD:   08/12/22  Best:          32w 5d     Det. ByMarcella Dubs         EDD:   08/03/22                                      (02/07/22) ---------------------------------------------------------------------- Anatomy  Cranium:               Appears normal         LVOT:                   Previously seen  Cavum:                 Previously             Aortic Arch:            Previously seen                         visualized  Ventricles:            Previously seen        Ductal Arch:            Previously seen  Choroid Plexus:        Previously seen        Diaphragm:              Appears normal  Cerebellum:            Previously seen        Stomach:                Appears normal, left                                                                        sided  Posterior Fossa:       Previously seen        Abdomen:                Previously seen  Nuchal Fold:           Not applicable (>20  Abdominal Wall:         Previously seen                         wks GA)  Face:                  Orbits and profile     Cord Vessels:           Previously seen                         previously seen  Lips:                  Previously seen        Kidneys:                Appear normal  Palate:                Previously             Bladder:                Appears normal                         visualized  Thoracic:              Previously seen        Spine:                  Previously seen  Heart:                 Appears normal;        Upper Extremities:      Previously seen                         Left  EIF prev seen  RVOT:                  Previously seen        Lower Extremities:      Previously seen  Other:  Female gender previously seen. Heels prev visualized. Open hands          prev visualized. Nasal bone prev visualized. 3VV prev visualized. ---------------------------------------------------------------------- Doppler - Fetal Vessels  Umbilical Artery   S/D     %tile      RI    %tile      PI    %tile            ADFV    RDFV    3.1       77     0.7       86     1.1       85               No      No ---------------------------------------------------------------------- Cervix Uterus Adnexa  Cervix  Not visualized (advanced GA >24wks)  Uterus  No abnormality visualized.  Right Ovary  Not visualized.  Left Ovary  Simple cyst measuring 7x5x6 cm  Cul De Sac  No free fluid seen.  Adnexa  No abnormality visualized ---------------------------------------------------------------------- Comments  The patient is here for a follow-up ultrasound for gDM and  new onset FGR  at 32w 5d. EDD: 08/03/2022. Dating: Early  Ultrasound  (02/07/22). She has no concerns today and  reports most of her blood sugars are well  controlled.  Sonographic findings  Single intrauterine pregnancy.  Fetal cardiac activity:  Observed and appears normal.  Presentation: Cephalic.  Interval fetal anatomy appears normal.  Fetal biometry shows the estimated fetal weight at the 8  percentile and the abdominal circumference at the 19  percentile.  Amniotic fluid volume: Within normal limits. AFI: 11.1 cm.  MVP: 4. cm.  Placenta: Anterior.  Umbilical artery dopplers findings:  -S/D:3.1 which are normal at this gestational age.  -Absent end-diastolic flow: No.  -Reversed end-diastolic flow:  No.  BPP 8/8.  Recommendations  I discussed the new diagnosis and potential causes and  clinical prognosis of FGR.  - Continue weekly UA dopplers and BPP until delivery.  - Serial growth [place] every 3 weeks until delivery.  - Delivery likely around 37 weeks or sooner if  indicated.  - I encouraged her to bring her blood sugar log and  expressed the risks such as fetal hypoxia and stillbirth in the  setting of FGR and diabetes ----------------------------------------------------------------------                  Valeda Malm, DO Electronically Signed Final Report   06/13/2022 04:53 pm ----------------------------------------------------------------------   MAU Course: Dilaudid 1 mg IM x 1 given U/S review showed stable, unruptured left ovarian cyst (final read pending), no renal pathology  Assessment: 1. Encntr for suprvsn of normal first pregnancy, unsp trimester   2. Ovarian cyst during pregnancy in third trimester   3. [redacted] weeks gestation of pregnancy   4. NST (non-stress test) reactive     Plan: Pain is likely 2/2 known ovarian cyst, no signs/symptoms of rupture, torsion or other surgical emergency.  Pain reduced by Dilaudid, Oxycodone and Tylenol prescribed for home use. Pain precautions reviewed. Preterm labor precautions and fetal kick counts reviewed Follow up with OB provider as scheduled Discharge home   Allergies as of 06/20/2022   No Known Allergies      Medication List     TAKE these medications    Accu-Chek Guide Me w/Device Kit 1 Device by Does not apply route in the morning, at noon, in the evening, and at bedtime.   Accu-Chek Guide test strip Generic drug: glucose blood Take blood sugars 4 times a day. Take when fasting before breakfast, after breakfast, after lunch and after dinner.   accu-chek multiclix lancets Use as instructed   acetaminophen 500 MG tablet Commonly known as: TYLENOL Take 2 tablets (1,000 mg total) by mouth every 6 (six) hours as needed for mild pain or moderate pain.   BL PRENATAL VITAMINS PO Take 1 tablet by mouth daily.   Blood Pressure Kit Devi 1 Device by Does not apply route once a week.   calcium carbonate 500 MG chewable tablet Commonly known as: TUMS - dosed in mg elemental calcium Chew 1  tablet by mouth daily.   docusate sodium 100 MG capsule Commonly known as: COLACE Take 1 capsule (100 mg total) by mouth 2 (two) times daily as needed for mild constipation or moderate constipation.   Gojji Weight Scale Misc 1 Device by Does not apply route as needed.   ondansetron 4 MG disintegrating tablet Commonly known as: ZOFRAN-ODT Take 1 tablet (4 mg total) by mouth every 6 (six) hours as needed for nausea.   oxyCODONE 5 MG immediate release tablet Commonly known as: Oxy IR/ROXICODONE Take 1 tablet (5 mg total) by mouth every 4 (four) hours as needed for severe pain or breakthrough pain.   pantoprazole 20 MG  tablet Commonly known as: Protonix Take 1 tablet (20 mg total) by mouth daily.        Osborne Oman, MD 06/20/2022 2:40 PM

## 2022-06-21 LAB — CULTURE, OB URINE: Culture: <10000 — AB

## 2022-06-27 ENCOUNTER — Ambulatory Visit: Payer: Medicaid Other | Attending: Maternal & Fetal Medicine

## 2022-06-27 ENCOUNTER — Encounter: Payer: Self-pay | Admitting: *Deleted

## 2022-06-27 ENCOUNTER — Ambulatory Visit: Payer: Medicaid Other | Admitting: *Deleted

## 2022-06-27 ENCOUNTER — Ambulatory Visit (INDEPENDENT_AMBULATORY_CARE_PROVIDER_SITE_OTHER): Payer: Medicaid Other

## 2022-06-27 VITALS — BP 112/66 | HR 94 | Wt 183.2 lb

## 2022-06-27 VITALS — BP 117/57 | HR 83

## 2022-06-27 DIAGNOSIS — Z3A34 34 weeks gestation of pregnancy: Secondary | ICD-10-CM

## 2022-06-27 DIAGNOSIS — O24419 Gestational diabetes mellitus in pregnancy, unspecified control: Secondary | ICD-10-CM | POA: Insufficient documentation

## 2022-06-27 DIAGNOSIS — O365990 Maternal care for other known or suspected poor fetal growth, unspecified trimester, not applicable or unspecified: Secondary | ICD-10-CM | POA: Insufficient documentation

## 2022-06-27 DIAGNOSIS — O99213 Obesity complicating pregnancy, third trimester: Secondary | ICD-10-CM | POA: Diagnosis not present

## 2022-06-27 DIAGNOSIS — O24410 Gestational diabetes mellitus in pregnancy, diet controlled: Secondary | ICD-10-CM | POA: Diagnosis not present

## 2022-06-27 DIAGNOSIS — O365930 Maternal care for other known or suspected poor fetal growth, third trimester, not applicable or unspecified: Secondary | ICD-10-CM | POA: Diagnosis not present

## 2022-06-27 DIAGNOSIS — O0993 Supervision of high risk pregnancy, unspecified, third trimester: Secondary | ICD-10-CM | POA: Diagnosis present

## 2022-06-27 DIAGNOSIS — O4703 False labor before 37 completed weeks of gestation, third trimester: Secondary | ICD-10-CM | POA: Diagnosis not present

## 2022-06-27 DIAGNOSIS — E669 Obesity, unspecified: Secondary | ICD-10-CM

## 2022-06-27 NOTE — Progress Notes (Signed)
Pt presents for ROB visit. No concerns at this time.

## 2022-06-27 NOTE — Progress Notes (Signed)
   PRENATAL VISIT NOTE  Subjective:  Kathryn Doyle is a 22 y.o. G1P0000 at [redacted]w[redacted]d being seen today for ongoing prenatal care.  She is currently monitored for the following issues for this high-risk pregnancy and has Supervision of high risk pregnancy in third trimester; Obesity in pregnancy; Gestational diabetes mellitus; Fetal growth restriction antepartum; and Ovarian cyst on their problem list.  Patient reports no complaints.  Contractions: Not present. Vag. Bleeding: None.  Movement: Present. Denies leaking of fluid.   The following portions of the patient's history were reviewed and updated as appropriate: allergies, current medications, past family history, past medical history, past social history, past surgical history and problem list.   Objective:   Vitals:   06/27/22 1035  BP: 112/66  Pulse: 94  Weight: 183 lb 3.2 oz (83.1 kg)    Fetal Status: Fetal Heart Rate (bpm): 160 Fundal Height: 34 cm Movement: Present     General:  Alert, oriented and cooperative. Patient is in no acute distress.  Skin: Skin is warm and dry. No rash noted.   Cardiovascular: Normal heart rate noted  Respiratory: Normal respiratory effort, no problems with respiration noted  Abdomen: Soft, gravid, appropriate for gestational age.  Pain/Pressure: Absent     Pelvic: Cervical exam deferred        Extremities: Normal range of motion.  Edema: None  Mental Status: Normal mood and affect. Normal behavior. Normal judgment and thought content.   Assessment and Plan:  Pregnancy: G1P0000 at [redacted]w[redacted]d 1. Supervision of high risk pregnancy in third trimester - Routine OB, doing well. No concerns - Anticipatory guidance for upcoming appointments provided - Cultures at next appointment  2. Gestational diabetes mellitus (GDM) in third trimester, gestational diabetes method of control unspecified - CBGs mostly within range. Fastings all within range. Sporadic elevated 2hr PP CBG- patient noted to have eaten a lot of  fruit. Reviewed fruit is okay but given the amount that she was eating during those meals, encouraged to eat smaller portion  3. [redacted] weeks gestation of pregnancy - FH appropriate - Endorses active fetal movement  4. Fetal growth restriction antepartum - Has appointment with MFM today - 1/31: Last growth 1727g, 8% with normal dopplers   Preterm labor symptoms and general obstetric precautions including but not limited to vaginal bleeding, contractions, leaking of fluid and fetal movement were reviewed in detail with the patient. Please refer to After Visit Summary for other counseling recommendations.   Return in about 2 weeks (around 07/11/2022) for ROB, GBS and GC/CT.  Future Appointments  Date Time Provider [place]  06/27/2022  2:45 PM [facility] NURSE [facility]  06/27/2022  3:00 PM [facility] US1 [facility]  07/04/2022  2:45 PM [facility] NURSE [facility]  07/04/2022  3:00 PM [facility] US1 [facility]  07/11/2022 10:35 AM Laury Deep, CNM [facility] None  07/11/2022  3:30 PM [facility] NURSE [facility]  07/11/2022  3:45 PM [facility] US7 [facility] [place], CNM

## 2022-07-04 ENCOUNTER — Ambulatory Visit: Payer: Medicaid Other | Admitting: *Deleted

## 2022-07-04 ENCOUNTER — Ambulatory Visit: Payer: Medicaid Other | Attending: Maternal & Fetal Medicine

## 2022-07-04 VITALS — BP 115/54 | HR 79

## 2022-07-04 DIAGNOSIS — O365930 Maternal care for other known or suspected poor fetal growth, third trimester, not applicable or unspecified: Secondary | ICD-10-CM | POA: Diagnosis not present

## 2022-07-04 DIAGNOSIS — O24410 Gestational diabetes mellitus in pregnancy, diet controlled: Secondary | ICD-10-CM | POA: Diagnosis not present

## 2022-07-04 DIAGNOSIS — O0993 Supervision of high risk pregnancy, unspecified, third trimester: Secondary | ICD-10-CM | POA: Diagnosis present

## 2022-07-04 DIAGNOSIS — O365990 Maternal care for other known or suspected poor fetal growth, unspecified trimester, not applicable or unspecified: Secondary | ICD-10-CM | POA: Diagnosis present

## 2022-07-04 DIAGNOSIS — O4703 False labor before 37 completed weeks of gestation, third trimester: Secondary | ICD-10-CM

## 2022-07-04 DIAGNOSIS — E669 Obesity, unspecified: Secondary | ICD-10-CM

## 2022-07-04 DIAGNOSIS — Z3A35 35 weeks gestation of pregnancy: Secondary | ICD-10-CM

## 2022-07-04 DIAGNOSIS — O24419 Gestational diabetes mellitus in pregnancy, unspecified control: Secondary | ICD-10-CM | POA: Diagnosis present

## 2022-07-04 DIAGNOSIS — O99213 Obesity complicating pregnancy, third trimester: Secondary | ICD-10-CM | POA: Insufficient documentation

## 2022-07-11 ENCOUNTER — Ambulatory Visit: Payer: Medicaid Other

## 2022-07-11 ENCOUNTER — Encounter: Payer: Self-pay | Admitting: Obstetrics and Gynecology

## 2022-07-11 ENCOUNTER — Other Ambulatory Visit (HOSPITAL_COMMUNITY)
Admission: RE | Admit: 2022-07-11 | Discharge: 2022-07-11 | Disposition: A | Discharge status: Home / Self Care | Payer: Medicaid Other | Source: Ambulatory Visit | Attending: Obstetrics and Gynecology | Admitting: Obstetrics and Gynecology

## 2022-07-11 ENCOUNTER — Ambulatory Visit: Payer: Medicaid Other | Attending: Maternal & Fetal Medicine | Admitting: *Deleted

## 2022-07-11 ENCOUNTER — Ambulatory Visit (INDEPENDENT_AMBULATORY_CARE_PROVIDER_SITE_OTHER): Payer: Medicaid Other | Admitting: Obstetrics and Gynecology

## 2022-07-11 ENCOUNTER — Ambulatory Visit (HOSPITAL_BASED_OUTPATIENT_CLINIC_OR_DEPARTMENT_OTHER): Payer: Medicaid Other

## 2022-07-11 VITALS — BP 108/63 | HR 98

## 2022-07-11 VITALS — BP 112/54 | HR 86 | Wt 186.2 lb

## 2022-07-11 DIAGNOSIS — O0993 Supervision of high risk pregnancy, unspecified, third trimester: Secondary | ICD-10-CM | POA: Insufficient documentation

## 2022-07-11 DIAGNOSIS — O365930 Maternal care for other known or suspected poor fetal growth, third trimester, not applicable or unspecified: Secondary | ICD-10-CM | POA: Diagnosis not present

## 2022-07-11 DIAGNOSIS — O24419 Gestational diabetes mellitus in pregnancy, unspecified control: Secondary | ICD-10-CM

## 2022-07-11 DIAGNOSIS — Z3A36 36 weeks gestation of pregnancy: Secondary | ICD-10-CM | POA: Insufficient documentation

## 2022-07-11 DIAGNOSIS — E669 Obesity, unspecified: Secondary | ICD-10-CM | POA: Insufficient documentation

## 2022-07-11 DIAGNOSIS — O24410 Gestational diabetes mellitus in pregnancy, diet controlled: Secondary | ICD-10-CM | POA: Diagnosis not present

## 2022-07-11 DIAGNOSIS — O365993 Maternal care for other known or suspected poor fetal growth, unspecified trimester, fetus 3: Secondary | ICD-10-CM | POA: Insufficient documentation

## 2022-07-11 DIAGNOSIS — O99213 Obesity complicating pregnancy, third trimester: Secondary | ICD-10-CM | POA: Diagnosis not present

## 2022-07-11 DIAGNOSIS — O365990 Maternal care for other known or suspected poor fetal growth, unspecified trimester, not applicable or unspecified: Secondary | ICD-10-CM

## 2022-07-11 NOTE — Progress Notes (Signed)
  HIGH-RISK PREGNANCY OFFICE VISIT Patient name: Kathryn Doyle MRN [id number]  Date of birth: 01-15-2001 Chief Complaint:   Routine Prenatal Visit  History of Present Illness:   Kathryn Doyle is a 22 y.o. G56P0000 female at 73w5dwith an Estimated Date of Delivery: 08/03/22 being seen today for ongoing management of a high-risk pregnancy complicated by fetal growth restriction 14% (RESOLVED) Today she reports no complaints. Contractions: Not present. Vag. Bleeding: None.  Movement: Present. denies leaking of fluid.  Review of Systems:   Pertinent items are noted in HPI Denies abnormal vaginal discharge w/ itching/odor/irritation, headaches, visual changes, shortness of breath, chest pain, abdominal pain, severe nausea/vomiting, or problems with urination or bowel movements unless otherwise stated above. Pertinent History Reviewed:  Reviewed past medical,surgical, social, obstetrical and family history.  Reviewed problem list, medications and allergies. Physical Assessment:   Vitals:   07/11/22 1031  BP: (!) 112/54  Pulse: 86  Weight: 186 lb 3.2 oz (84.5 kg)  Body mass index is 35.18 kg/m.           Physical Examination:   General appearance: alert, well appearing, and in no distress, oriented to person, place, and time, and overweight  Mental status: alert, oriented to person, place, and time, normal mood, behavior, speech, dress, motor activity, and thought processes  Skin: warm & dry   Extremities: Edema: None    Cardiovascular: normal heart rate noted  Respiratory: normal respiratory effort, no distress  Abdomen: gravid, soft, non-tender  Pelvic: Cervical exam deferred         Fetal Status: Fetal Heart Rate (bpm): 155 Fundal Height: 34 cm Movement: Present Presentation: Vertex  Fetal Surveillance Testing today: U/S & BPP today   No results found for this or any previous visit (from the past 24 hour(s)).  Assessment & Plan:  1) High-risk pregnancy G1P0000 at 385w5dith an  Estimated Date of Delivery: 08/03/22   2) Supervision of high risk pregnancy in third trimester - Information provided on GBS testing  - Culture, beta strep (group b only) - Cervicovaginal ancillary only( [place])  3) Fetal growth restriction antepartum - RESOLVED per MFM final report on 07/11/2022  4) [redacted] weeks gestation of pregnancy    Meds: No orders of the defined types were placed in this encounter.   Labs/procedures today: BPP and Doppler with MFM  Treatment Plan:  expectant management  Reviewed: Preterm labor symptoms and general obstetric precautions including but not limited to vaginal bleeding, contractions, leaking of fluid and fetal movement were reviewed in detail with the patient.  All questions were answered. Has home bp cuff. Check bp weekly, let usKoreanow if >140/90.   Follow-up: Return in about 1 week (around 07/18/2022) for Return OB visit.  Orders Placed This Encounter  Procedures   Culture, beta strep (group b only)   RoLaury DeepSN, CNM 07/11/2022 10:52 AM

## 2022-07-11 NOTE — Progress Notes (Signed)
Pt presents for ROB visit. No concerns at this time.

## 2022-07-11 NOTE — Patient Instructions (Signed)

## 2022-07-12 ENCOUNTER — Encounter: Payer: Self-pay | Admitting: Obstetrics and Gynecology

## 2022-07-12 LAB — CERVICOVAGINAL ANCILLARY ONLY
Chlamydia: NEGATIVE
Comment: NEGATIVE
Comment: NEGATIVE
Comment: NORMAL
Neisseria Gonorrhea: NEGATIVE
Trichomonas: NEGATIVE

## 2022-07-15 LAB — CULTURE, BETA STREP (GROUP B ONLY): Strep Gp B Culture: NEGATIVE

## 2022-07-16 ENCOUNTER — Encounter: Payer: Self-pay | Admitting: Obstetrics and Gynecology

## 2022-07-17 ENCOUNTER — Encounter (HOSPITAL_COMMUNITY): Payer: Self-pay | Admitting: Obstetrics & Gynecology

## 2022-07-17 ENCOUNTER — Inpatient Hospital Stay (HOSPITAL_COMMUNITY)
Admission: AD | Admit: 2022-07-17 | Discharge: 2022-07-17 | Disposition: A | Discharge status: Home / Self Care | Payer: Medicaid Other | Attending: Obstetrics & Gynecology | Admitting: Obstetrics & Gynecology

## 2022-07-17 DIAGNOSIS — O471 False labor at or after 37 completed weeks of gestation: Secondary | ICD-10-CM | POA: Insufficient documentation

## 2022-07-17 DIAGNOSIS — Z3689 Encounter for other specified antenatal screening: Secondary | ICD-10-CM

## 2022-07-17 DIAGNOSIS — Z3A37 37 weeks gestation of pregnancy: Secondary | ICD-10-CM | POA: Diagnosis not present

## 2022-07-17 DIAGNOSIS — R197 Diarrhea, unspecified: Secondary | ICD-10-CM | POA: Diagnosis not present

## 2022-07-17 DIAGNOSIS — O212 Late vomiting of pregnancy: Secondary | ICD-10-CM | POA: Diagnosis not present

## 2022-07-17 DIAGNOSIS — O479 False labor, unspecified: Secondary | ICD-10-CM

## 2022-07-17 LAB — URINALYSIS, ROUTINE W REFLEX MICROSCOPIC
Bilirubin Urine: NEGATIVE
Glucose, UA: NEGATIVE mg/dL
Hgb urine dipstick: NEGATIVE
Ketones, ur: 80 mg/dL — AB
Nitrite: NEGATIVE
Protein, ur: 30 mg/dL — AB
Specific Gravity, Urine: 1.025 (ref 1.005–1.030)
pH: 6 (ref 5.0–8.0)

## 2022-07-17 MED ORDER — LACTATED RINGERS IV BOLUS
1000.0000 mL | Freq: Once | INTRAVENOUS | Status: AC
  Administered 2022-07-17: 1000 mL via INTRAVENOUS

## 2022-07-17 MED ORDER — ZOLPIDEM TARTRATE 5 MG PO TABS: 5.0000 mg | ORAL_TABLET | Freq: Every evening | ORAL | 0 refills | Status: DC | PRN

## 2022-07-17 MED ORDER — ONDANSETRON HCL 4 MG/2ML IJ SOLN
4.0000 mg | Freq: Once | INTRAMUSCULAR | Status: AC
  Administered 2022-07-17: 4 mg via INTRAVENOUS
  Filled 2022-07-17: qty 2

## 2022-07-17 NOTE — MAU Note (Signed)
Kathryn Doyle is a 22 y.o. at 90w4dhere in MAU reporting: contractions started at 0912, no bleeding or leaking. Has not been checked. States sugars are well controlled.  +FM.  Has been drinking raspberry tea.  Started having loose stools yesterday.  Onset of complaint: 0912 Pain score: 10 Vitals:   07/17/22 1051  BP: 129/76  Pulse: 91  Resp: 20  Temp: 98 F (36.7 C)  SpO2: 97%     FHT:144 Lab orders placed from triage:

## 2022-07-17 NOTE — MAU Note (Signed)
Pt vomited moderate amount. Notified provider. Zofran ordered.

## 2022-07-17 NOTE — MAU Provider Note (Signed)
History     CSN: [id number]  Arrival date and time: 07/17/22 1029  Event Date/Time  First Provider Initiated Contact with Patient 07/17/22 1046     Chief Complaint  Patient presents with   Contractions   HPI Kathryn Doyle is a 22 y.o. G1P0000 at 71w4dwho presents to MAU with chief complaint of contractions. This is a new problem, onset at 0900 today. Pain score on arrival to MAU is 10/10. Patient has "leftover" Oxycodone from a previous MAU visit which she took about two hours ago. She did not experience relief. She endorses drinking one bottle of water so far today. She has also been drinking red raspberry leaf tea.  Shortly after her initial assessment patient reports acute onset nausea and vomited. She denies vomiting prior to this episode. She has not been febrile and denies sick contacts.  Patient receives care with [facility]. Pregnancy c/b A1GDM.  She denies vaginal bleeding, leaking of fluid, decreased fetal movement, fever, falls, or recent illness.   OB History     Gravida  1   Para  0   Term  0   Preterm  0   AB  0   Living  0      SAB  0   IAB  0   Ectopic  0   Multiple  0   Live Births  0           Past Medical History:  Diagnosis Date   Episodic tension-type headache, not intractable 07/01/2018   Gestational diabetes    Migraine without aura and without status migrainosus, not intractable 07/01/2018    Past Surgical History:  Procedure Laterality Date   CHOLECYSTECTOMY  02/2020    Family History  Problem Relation Age of Onset   Stroke Father    Hypertension Father    Diabetes Father    Hypertension Mother    Diabetes Mother    Leukemia Other     Social History   Tobacco Use   Smoking status: Never   Smokeless tobacco: Never  Vaping Use   Vaping Use: Never used  Substance Use Topics   Alcohol use: No    Alcohol/week: 0.0 standard drinks of alcohol   Drug use: No    Allergies: No Known Allergies  Medications Prior to  Admission  Medication Sig Dispense Refill Last Dose   acetaminophen (TYLENOL) 500 MG tablet Take 2 tablets (1,000 mg total) by mouth every 6 (six) hours as needed for mild pain or moderate pain. (Patient not taking: Reported on 07/04/2022) 200 tablet 1    Blood Glucose Monitoring Suppl (ACCU-CHEK GUIDE ME) w/Device KIT 1 Device by Does not apply route in the morning, at noon, in the evening, and at bedtime. 1 kit 0    Blood Pressure Monitoring (BLOOD PRESSURE KIT) DEVI 1 Device by Does not apply route once a week. 1 each 0    calcium carbonate (TUMS - DOSED IN MG ELEMENTAL CALCIUM) 500 MG chewable tablet Chew 1 tablet by mouth daily.      docusate sodium (COLACE) 100 MG capsule Take 1 capsule (100 mg total) by mouth 2 (two) times daily as needed for mild constipation or moderate constipation. (Patient not taking: Reported on 07/04/2022) 30 capsule 2    glucose blood (ACCU-CHEK GUIDE) test strip Take blood sugars 4 times a day. Take when fasting before breakfast, after breakfast, after lunch and after dinner. 100 each 12    Lancets (ACCU-CHEK MULTICLIX) lancets Use as instructed 100  each 12    Misc. Devices (GOJJI WEIGHT SCALE) MISC 1 Device by Does not apply route as needed. 1 each 0    ondansetron (ZOFRAN-ODT) 4 MG disintegrating tablet Take 1 tablet (4 mg total) by mouth every 6 (six) hours as needed for nausea. 20 tablet 0    oxyCODONE (OXY IR/ROXICODONE) 5 MG immediate release tablet Take 1 tablet (5 mg total) by mouth every 4 (four) hours as needed for severe pain or breakthrough pain. 30 tablet 0    pantoprazole (PROTONIX) 20 MG tablet Take 1 tablet (20 mg total) by mouth daily. (Patient not taking: Reported on 07/04/2022) 30 tablet 1    Prenatal Vit-Fe Fumarate-FA (BL PRENATAL VITAMINS PO) Take 1 tablet by mouth daily.       Review of Systems  Gastrointestinal:  Positive for abdominal pain, diarrhea, nausea and vomiting.  All other systems reviewed and are negative.  Physical Exam   Last  menstrual period 10/09/2021.  Physical Exam Vitals and nursing note reviewed. Exam conducted with a chaperone present.  Constitutional:      General: She is in acute distress.     Appearance: She is obese. She is not ill-appearing.  Cardiovascular:     Rate and Rhythm: Normal rate.     Pulses: Normal pulses.  Pulmonary:     Effort: Pulmonary effort is normal.  Abdominal:     Comments: Gravid  Skin:    Capillary Refill: Capillary refill takes less than 2 seconds.  Neurological:     Mental Status: She is alert and oriented to person, place, and time.  Psychiatric:        Mood and Affect: Mood normal.        Behavior: Behavior normal.        Thought Content: Thought content normal.        Judgment: Judgment normal.     MAU Course  Procedures  MDM --Cervix 0/thick/posterior on arrival. Patient fully reclined to reach cervix.  --Reactive tracing: baseline 125, mod var, + accels, no decels --Toco: predominantly UI, rare contractions  Orders Placed This Encounter  Procedures   Culture, OB Urine   Urinalysis, Routine w reflex microscopic -Urine, Clean Catch   Insert peripheral IV   Discharge patient   Patient Vitals for the past 24 hrs:  BP Temp Temp src Pulse Resp SpO2  07/17/22 1242 107/65 -- -- 90 -- --  07/17/22 1053 129/76 -- -- 84 -- --  07/17/22 1051 129/76 98 F (36.7 C) Oral 91 20 97 %   Past Surgical History: Past Surgical History:  Procedure Laterality Date   CHOLECYSTECTOMY  02/2020    Social History: Social History   Tobacco Use   Smoking status: Never   Smokeless tobacco: Never  Vaping Use   Vaping Use: Never used  Substance Use Topics   Alcohol use: No    Alcohol/week: 0.0 standard drinks of alcohol   Drug use: No   Results for orders placed or performed during the hospital encounter of 07/17/22 (from the past 24 hour(s))  Urinalysis, Routine w reflex microscopic -Urine, Clean Catch     Status: Abnormal   Collection Time: 07/17/22 12:35 PM   Result Value Ref Range   Color, Urine AMBER (A) YELLOW   APPearance CLOUDY (A) CLEAR   Specific Gravity, Urine 1.025 1.005 - 1.030   pH 6.0 5.0 - 8.0   Glucose, UA NEGATIVE NEGATIVE mg/dL   Hgb urine dipstick NEGATIVE NEGATIVE   Bilirubin Urine NEGATIVE NEGATIVE  Ketones, ur 80 (A) NEGATIVE mg/dL   Protein, ur 30 (A) NEGATIVE mg/dL   Nitrite NEGATIVE NEGATIVE   Leukocytes,Ua TRACE (A) NEGATIVE   RBC / HPF 0-5 0 - 5 RBC/hpf   WBC, UA 6-10 0 - 5 WBC/hpf   Bacteria, UA MANY (A) NONE SEEN   Squamous Epithelial / HPF 6-10 0 - 5 /HPF   Mucus PRESENT    Meds ordered this encounter  Medications   lactated ringers bolus 1,000 mL   ondansetron (ZOFRAN) injection 4 mg   zolpidem (AMBIEN) 5 MG tablet    Sig: Take 1 tablet (5 mg total) by mouth at bedtime as needed for up to 5 doses for sleep.    Dispense:  5 tablet    Refill:  0    Order Specific Question:   Supervising Provider    Answer:   Wood[place] Mode [id number]   Assessment and Plan  -22 y.o. G1P0000 at [redacted]w[redacted]d --Reactive tracing --Rare contractions on toco --Cervix remains closed/thick/ballotable 1 hour, 45 min after initial exam --Strongly encouraged to increase hydration with water --Patient encouraged to return unused Oxycodone from 06/20/2022 to pharmacy dropbox --Discharge home in stable condition with labor precautions  F/U: --Patient's next prenatal appointment is tomorrow   SDarlina Rumpf MRaleigh MSN, CNM 07/17/2022, 1:43 PM

## 2022-07-18 ENCOUNTER — Ambulatory Visit: Payer: Medicaid Other

## 2022-07-18 ENCOUNTER — Ambulatory Visit: Payer: Medicaid Other | Attending: Pediatrics

## 2022-07-18 ENCOUNTER — Encounter: Payer: Self-pay | Admitting: Obstetrics

## 2022-07-18 ENCOUNTER — Ambulatory Visit (INDEPENDENT_AMBULATORY_CARE_PROVIDER_SITE_OTHER): Payer: Medicaid Other | Admitting: Obstetrics

## 2022-07-18 VITALS — BP 111/61 | HR 94 | Wt 187.0 lb

## 2022-07-18 DIAGNOSIS — O24410 Gestational diabetes mellitus in pregnancy, diet controlled: Secondary | ICD-10-CM

## 2022-07-18 DIAGNOSIS — O365930 Maternal care for other known or suspected poor fetal growth, third trimester, not applicable or unspecified: Secondary | ICD-10-CM

## 2022-07-18 DIAGNOSIS — O0993 Supervision of high risk pregnancy, unspecified, third trimester: Secondary | ICD-10-CM | POA: Diagnosis not present

## 2022-07-18 DIAGNOSIS — Z3A37 37 weeks gestation of pregnancy: Secondary | ICD-10-CM

## 2022-07-18 DIAGNOSIS — O365990 Maternal care for other known or suspected poor fetal growth, unspecified trimester, not applicable or unspecified: Secondary | ICD-10-CM

## 2022-07-18 LAB — CULTURE, OB URINE: Culture: <10000 — AB

## 2022-07-18 NOTE — Progress Notes (Signed)
ROB Fasting 85-90 / 2 hrs 100-115

## 2022-07-18 NOTE — Progress Notes (Signed)
Subjective:  Kathryn Doyle is a 22 y.o. G1P0000 at 75w5dbeing seen today for ongoing prenatal care.  She is currently monitored for the following issues for this high-risk pregnancy and has Supervision of high risk pregnancy in third trimester; Obesity in pregnancy; Gestational diabetes mellitus; Fetal growth restriction antepartum; and Ovarian cyst on their problem list.  Patient reports no complaints.   .  .   . Denies leaking of fluid.   The following portions of the patient's history were reviewed and updated as appropriate: allergies, current medications, past family history, past medical history, past social history, past surgical history and problem list. Problem list updated.  Objective:  There were no vitals filed for this visit.  Fetal Status:           General:  Alert, oriented and cooperative. Patient is in no acute distress.  Skin: Skin is warm and dry. No rash noted.   Cardiovascular: Normal heart rate noted  Respiratory: Normal respiratory effort, no problems with respiration noted  Abdomen: Soft, gravid, appropriate for gestational age.       Pelvic:  Cervical exam deferred        Extremities: Normal range of motion.     Mental Status: Normal mood and affect. Normal behavior. Normal judgment and thought content.   Urinalysis:        [place]FM UA CORD DOPPLER (Accession [id number] (Order [id number] Imaging Date: 07/11/2022 [facility] Released By: WClaudius SisAuthorizing: SValeda Malm DO   Exam Status  Status  Final [99]   Study Result  Narrative & Impression  ----------------------------------------------------------------------  OBSTETRICS REPORT                       (Signed Final 07/11/2022 08:12 am) ---------------------------------------------------------------------- Patient Info  ID #:       [id number]                         D.O.B.:  0November 16, 2002(21 yrs)  Name:       TJamie Doyle                  Visit Date: 07/11/2022 07:58 am ---------------------------------------------------------------------- Performed By  Attending:        RTama HighMD        Ref. Address:     [address]  [place]                                                             [place]  Performed By:     Milus Glazier,      Secondary Phy.:   [facility] MAU/Triage                    RDMS  Referred By:      Justice             Location:         [facility]                                                             [place] [facility]                                                             [facility] ---------------------------------------------------------------------- Orders  #  Description                           Code        Ordered By  1  [place] MFM FETAL BPP WO NON               76819.01    Coffee  2  [place] MFM UA CORD DOPPLER                76820.02    [facility] ----------------------------------------------------------------------  #  Order #                     Accession #                Episode #  1  [id number]                   [id number]                 [id number]  2  [id number]                   [id number]                 [id number] ---------------------------------------------------------------------- Indications  [redacted] weeks gestation of pregnancy                Z3A.36  Gestational diabetes in pregnancy, diet        O24.410  controlled  Fetal growth restriction  99991111  Obesity complicating pregnancy (BMI 30)        O99.210 E66.9  LR NIPS/Neg Horizon/Neg AFP ---------------------------------------------------------------------- Fetal Evaluation  Num Of Fetuses:         1  Fetal Heart  Rate(bpm):  131  Cardiac Activity:       Observed  Presentation:           Cephalic  Placenta:               Anterior  P. Cord Insertion:      Previously visualized  Amniotic Fluid  AFI FV:      Within normal limits  AFI Sum(cm)     %Tile       Largest Pocket(cm)  17.95           68          5.73  RUQ(cm)       RLQ(cm)       LUQ(cm)        LLQ(cm)  5.73          3.64          3.73           4.85 ---------------------------------------------------------------------- Biophysical Evaluation  Amniotic F.V:   Within normal limits       F. Tone:        Observed  F. Movement:    Observed                   Score:          8/8  F. Breathing:   Observed ---------------------------------------------------------------------- Biometry  LV:          4  mm ---------------------------------------------------------------------- OB History  Gravidity:    1         Term:   0        Prem:   0        SAB:   0  TOP:          0       Ectopic:  0        Living: 0 ---------------------------------------------------------------------- Gestational Age  LMP:           39w 2d        Date:  10/09/21                  EDD:   07/16/22  Best:          36w 5d     Det. By:  Loman Chroman         EDD:   08/03/22                                      (02/07/22) ---------------------------------------------------------------------- Anatomy  Cranium:               Appears normal         Aortic Arch:            Previously seen  Cavum:                 Previously             Ductal Arch:            Previously seen                         visualized  Ventricles:  Appears normal         Diaphragm:              Appears normal  Choroid Plexus:        Previously seen        Stomach:                Appears normal, left                                                                        sided  Cerebellum:            Previously seen        Abdomen:                Appears normal  Posterior Fossa:       Previously seen         Abdominal Wall:         Previously seen  Nuchal Fold:           Previously seen        Cord Vessels:           Appears normal (3                                                                        vessel cord)  Face:                  Orbits and profile     Kidneys:                Appear normal                         previously seen  Lips:                  Previously seen        Bladder:                Appears normal  Thoracic:              Appears normal         Spine:                  Previously seen  Heart:                 Previously seen        Upper Extremities:      Previously seen  RVOT:                  Previously seen        Lower Extremities:      Previously seen  LVOT:                  Previously seen ---------------------------------------------------------------------- Doppler - Fetal Vessels  Umbilical Artery   S/D     %tile      RI    %tile  PI    %tile     PSV    ADFV    RDFV                                                     (cm/s)   2.75       75    0.64       80    1.02       86     38.9      No      No ---------------------------------------------------------------------- Cervix Uterus Adnexa  Right Ovary  Not visualized.  Left Ovary  Not visualized. ---------------------------------------------------------------------- Impression  Gestational diabetes.  Well-controlled on diet.  Previously seen fetal growth restriction had resolved.  On  ultrasound performed last week, estimated fetal weight was  at the 14th percentile.  Amniotic fluid is normal and good fetal activity seen.  Antenatal testing is reassuring.  Umbilical artery Doppler  showed normal forward diastolic flow.  BPP 8/8. ---------------------------------------------------------------------- Recommendations  -BPP or NST next week.  -Consider delivery at 72 weeks' gestation. ----------------------------------------------------------------------                 Tama High,  MD Electronically Signed Final Report   07/11/2022 08:12 am ----------------------------------------------------------------------      Assessment and Plan:  Pregnancy: G1P0000 at [redacted]w[redacted]d 1. Supervision of high risk pregnancy in third trimester  2. Fetal growth restriction antepartum, resolved - EFW at 14th percentile at 36 weeks  3. Diet controlled gestational diabetes mellitus (GDM) in third trimester - good control:  FBS's < 90 and 2 hour PP's < 120 - delivery recommended at 39 weeks, per MFM   Term labor symptoms and general obstetric precautions including but not limited to vaginal bleeding, contractions, leaking of fluid and fetal movement were reviewed in detail with the patient. Please refer to After Visit Summary for other counseling recommendations.   [facility] (around [facility]25[facility]2022[facility]   HShelly Bombard MD 07/18/22

## 2022-07-19 NOTE — Addendum Note (Signed)
Addended by: Baltazar Najjar A on: 07/19/2022 01:50 PM   Modules accepted: Orders

## 2022-07-24 ENCOUNTER — Telehealth (HOSPITAL_COMMUNITY): Payer: Self-pay | Admitting: *Deleted

## 2022-07-24 ENCOUNTER — Encounter (HOSPITAL_COMMUNITY): Payer: Self-pay | Admitting: *Deleted

## 2022-07-24 NOTE — Telephone Encounter (Signed)
Preadmission screen  

## 2022-07-25 ENCOUNTER — Other Ambulatory Visit: Payer: Self-pay | Admitting: Advanced Practice Midwife

## 2022-07-25 ENCOUNTER — Ambulatory Visit (INDEPENDENT_AMBULATORY_CARE_PROVIDER_SITE_OTHER): Payer: Medicaid Other | Admitting: Obstetrics

## 2022-07-25 ENCOUNTER — Encounter: Payer: Self-pay | Admitting: Obstetrics

## 2022-07-25 VITALS — BP 115/72 | HR 81 | Wt 189.4 lb

## 2022-07-25 DIAGNOSIS — O365990 Maternal care for other known or suspected poor fetal growth, unspecified trimester, not applicable or unspecified: Secondary | ICD-10-CM

## 2022-07-25 DIAGNOSIS — O0993 Supervision of high risk pregnancy, unspecified, third trimester: Secondary | ICD-10-CM

## 2022-07-25 DIAGNOSIS — O3483 Maternal care for other abnormalities of pelvic organs, third trimester: Secondary | ICD-10-CM

## 2022-07-25 DIAGNOSIS — Z3A38 38 weeks gestation of pregnancy: Secondary | ICD-10-CM

## 2022-07-25 DIAGNOSIS — N83209 Unspecified ovarian cyst, unspecified side: Secondary | ICD-10-CM

## 2022-07-25 DIAGNOSIS — O24410 Gestational diabetes mellitus in pregnancy, diet controlled: Secondary | ICD-10-CM

## 2022-07-25 DIAGNOSIS — O365930 Maternal care for other known or suspected poor fetal growth, third trimester, not applicable or unspecified: Secondary | ICD-10-CM

## 2022-07-25 NOTE — Progress Notes (Unsigned)
Pt reports fetal movement with some contractions  Pt reports fasting BG today 86

## 2022-07-25 NOTE — Progress Notes (Unsigned)
Subjective:  Kathryn Doyle is a 22 y.o. G1P0000 at 45w5dbeing seen today for ongoing prenatal care.  She is currently monitored for the following issues for this {Blank single:19197::"high-risk","low-risk"} pregnancy and has Supervision of high risk pregnancy in third trimester; Obesity in pregnancy; Gestational diabetes mellitus; Fetal growth restriction antepartum; and Ovarian cyst on their problem list.  Patient reports {sx:14538}.  Contractions: Irregular. Vag. Bleeding: None.  Movement: Present. Denies leaking of fluid.   The following portions of the patient's history were reviewed and updated as appropriate: allergies, current medications, past family history, past medical history, past social history, past surgical history and problem list. Problem list updated.  Objective:   Vitals:   07/25/22 1114  BP: 115/72  Pulse: 81  Weight: 189 lb 6.4 oz (85.9 kg)    Fetal Status: Fetal Heart Rate (bpm): 141   Movement: Present     General:  Alert, oriented and cooperative. Patient is in no acute distress.  Skin: Skin is warm and dry. No rash noted.   Cardiovascular: Normal heart rate noted  Respiratory: Normal respiratory effort, no problems with respiration noted  Abdomen: Soft, gravid, appropriate for gestational age. Pain/Pressure: Present     Pelvic:  {Blank single:19197::"Cervical exam performed","Cervical exam deferred"}        Extremities: Normal range of motion.  Edema: None  Mental Status: Normal mood and affect. Normal behavior. Normal judgment and thought content.   Urinalysis:      Assessment and Plan:  Pregnancy: G1P0000 at 343w5d1. Supervision of high risk pregnancy in third trimester ***  2. Diet controlled gestational diabetes mellitus (GDM) in third trimester ***  3. Fetal growth restriction antepartum ***  4. Ovarian cyst during pregnancy in third trimester ***  {Blank single:19197::"Term","Preterm"} labor symptoms and general obstetric precautions  including but not limited to vaginal bleeding, contractions, leaking of fluid and fetal movement were reviewed in detail with the patient. Please refer to After Visit Summary for other counseling recommendations.   No follow-ups on file.   HaShelly BombardMD 07/25/22

## 2022-07-27 ENCOUNTER — Other Ambulatory Visit (HOSPITAL_COMMUNITY): Payer: Self-pay | Admitting: Advanced Practice Midwife

## 2022-07-27 NOTE — H&P (Addendum)
OBSTETRIC ADMISSION HISTORY AND PHYSICAL  Kathryn Doyle is a 22 y.o. female G1P0000 with IUP at [redacted]w[redacted]d by U/S presenting for IOL for GDM. She reports +FMs, No LOF, no VB, no blurry vision, headaches or peripheral edema, and RUQ pain.  She plans on formula feeding. She request condoms for birth control. She received her prenatal care at  [facility]    Dating: By [place] --->  Estimated Date of Delivery: 08/03/22  Sono:    @[redacted]w[redacted]d , CWD, normal anatomy, cephalic presentation,  2367g, 14% EFW   Prenatal History/Complications: A1GDM, hx of FGR (now resolved)  Past Medical History: Past Medical History:  Diagnosis Date   Episodic tension-type headache, not intractable 07/01/2018   Gestational diabetes    Migraine without aura and without status migrainosus, not intractable 07/01/2018    Past Surgical History: Past Surgical History:  Procedure Laterality Date   CHOLECYSTECTOMY  02/2020    Obstetrical History: OB History     Gravida  1   Para  0   Term  0   Preterm  0   AB  0   Living  0      SAB  0   IAB  0   Ectopic  0   Multiple  0   Live Births  0           Social History Social History   Socioeconomic History   Marital status: Single    Spouse name: Not on file   Number of children: Not on file   Years of education: Not on file   Highest education level: Not on file  Occupational History   Not on file  Tobacco Use   Smoking status: Never   Smokeless tobacco: Never  Vaping Use   Vaping Use: Never used  Substance and Sexual Activity   Alcohol use: No    Alcohol/week: 0.0 standard drinks of alcohol   Drug use: No   Sexual activity: Not Currently    Partners: Male    Birth control/protection: None    Comment: currently pregnant  Other Topics Concern   Not on file  Social History Narrative   Kathryn Doyle is an 11th grade student.   She attends [employer].   She lives with both parents. She has six sisters.   She enjoys sleeping, shopping, and  watching tv.   Social Determinants of Health   Financial Resource Strain: Not on file  Food Insecurity: No Food Insecurity (05/16/2022)   Hunger Vital Sign    Worried About Running Out of Food in the Last Year: Never true    Ran Out of Food in the Last Year: Never true  Transportation Needs: Not on file  Physical Activity: Not on file  Stress: Not on file  Social Connections: Not on file    Family History: Family History  Problem Relation Age of Onset   Stroke Father    Hypertension Father    Diabetes Father    Hypertension Mother    Diabetes Mother    Leukemia Other     Allergies: No Known Allergies  No medications prior to admission.     Review of Systems   All systems reviewed and negative except as stated in HPI  Last menstrual period 10/09/2021. General appearance: alert, cooperative, appears stated age, and no distress Lungs: clear to auscultation bilaterally Heart: regular rate and rhythm Abdomen: soft, non-tender; bowel sounds normal Pelvic: performed by another provider Extremities: no sign of DVT Presentation: cephalic Fetal monitoringBaseline: 140  bpm, Variability: Good {> 6 bpm), Accelerations: Reactive, and Decelerations: Absent Uterine activityNone    Prenatal labs: ABO, Rh: O/Positive/-- (11/29 0910) Antibody: Negative (11/29 0910) Rubella: 7.00 (11/29 0910) RPR: Non Reactive (12/20 0940)  HBsAg: Negative (11/29 0910)  HIV: Non Reactive (12/20 0940)  GBS: Negative/-- (02/28 1136)  1 hr Glucola elevated Genetic screening: NIPS LR-female. AFP neg. Horizon neg.  Anatomy US normal 04/11/22  Prenatal Transfer Tool  Maternal Diabetes: Yes:  Diabetes Type:  Diet controlled Genetic Screening: Normal Maternal Ultrasounds/Referrals: Normal Fetal Ultrasounds or other Referrals:  None Maternal Substance Abuse:  No Significant Maternal Medications:  None Significant Maternal Lab Results:  Group B Strep negative Number of Prenatal Visits:greater than  3 verified prenatal visits Other Comments:  None  No results found for this or any previous visit (from the past 24 hour(s)).  Patient Active Problem List   Diagnosis Date Noted   Ovarian cyst 06/20/2022   Fetal growth restriction antepartum 06/13/2022   Gestational diabetes mellitus 05/04/2022   Obesity in pregnancy 04/11/2022   Supervision of high risk pregnancy in third trimester 03/07/2022    Assessment/Plan:  Kathryn Doyle is a 22 y.o. G1P0000 at [redacted]w[redacted]d here for IOL for A1GDM.   #Labor: Cytotec now. Consider FB, pit, and AROM as indicated. #Pain: Planning for epidural.  #FWB: Cat 1 tracing #ID:  GBS neg #MOF: formula #MOC: condoms #Circ:  Undecided  A1GDM - CBG q4  Lincoln Brigham, MD  07/27/2022, 10:35 PM  GME ATTESTATION:  I saw and evaluated the patient. I agree with the findings and the plan of care as documented in the resident's note. I have made changes to documentation as necessary.  Lavonda Jumbo, DO OB Fellow, Faculty [facility], [facility] 07/28/2022, 6:14 AM

## 2022-07-28 ENCOUNTER — Inpatient Hospital Stay (HOSPITAL_COMMUNITY): Payer: Medicaid Other | Admitting: Anesthesiology

## 2022-07-28 ENCOUNTER — Other Ambulatory Visit: Payer: Self-pay

## 2022-07-28 ENCOUNTER — Encounter (HOSPITAL_COMMUNITY): Payer: Self-pay | Admitting: Obstetrics and Gynecology

## 2022-07-28 ENCOUNTER — Inpatient Hospital Stay (HOSPITAL_COMMUNITY)
Admission: RE | Admit: 2022-07-28 | Discharge: 2022-07-30 | DRG: 807 | Disposition: A | Discharge status: Home / Self Care | Payer: Medicaid Other | Attending: Obstetrics and Gynecology | Admitting: Obstetrics and Gynecology

## 2022-07-28 ENCOUNTER — Inpatient Hospital Stay (HOSPITAL_COMMUNITY): Payer: Medicaid Other

## 2022-07-28 DIAGNOSIS — O99214 Obesity complicating childbirth: Secondary | ICD-10-CM | POA: Diagnosis present

## 2022-07-28 DIAGNOSIS — O24420 Gestational diabetes mellitus in childbirth, diet controlled: Secondary | ICD-10-CM | POA: Diagnosis not present

## 2022-07-28 DIAGNOSIS — O24410 Gestational diabetes mellitus in pregnancy, diet controlled: Principal | ICD-10-CM | POA: Diagnosis present

## 2022-07-28 DIAGNOSIS — Z3A39 39 weeks gestation of pregnancy: Secondary | ICD-10-CM

## 2022-07-28 LAB — CBC
HCT: 35.2 % — ABNORMAL LOW (ref 36.0–46.0)
Hemoglobin: 12.3 g/dL (ref 12.0–15.0)
MCH: 31.5 pg (ref 26.0–34.0)
MCHC: 34.9 g/dL (ref 30.0–36.0)
MCV: 90 fL (ref 80.0–100.0)
Platelets: 311 10*3/uL (ref 150–400)
RBC: 3.91 MIL/uL (ref 3.87–5.11)
RDW: 13.5 % (ref 11.5–15.5)
WBC: 12.2 10*3/uL — ABNORMAL HIGH (ref 4.0–10.5)
nRBC: 0 % (ref 0.0–0.2)

## 2022-07-28 LAB — GLUCOSE, CAPILLARY
Glucose-Capillary: 136 mg/dL — ABNORMAL HIGH (ref 70–99)
Glucose-Capillary: 123 mg/dL — ABNORMAL HIGH (ref 70–99)
Glucose-Capillary: 122 mg/dL — ABNORMAL HIGH (ref 70–99)
Glucose-Capillary: 130 mg/dL — ABNORMAL HIGH (ref 70–99)
Glucose-Capillary: 135 mg/dL — ABNORMAL HIGH (ref 70–99)
Glucose-Capillary: 113 mg/dL — ABNORMAL HIGH (ref 70–99)
Glucose-Capillary: 129 mg/dL — ABNORMAL HIGH (ref 70–99)
Glucose-Capillary: 123 mg/dL — ABNORMAL HIGH (ref 70–99)
Glucose-Capillary: 95 mg/dL (ref 70–99)
Glucose-Capillary: 90 mg/dL (ref 70–99)
Glucose-Capillary: 130 mg/dL — ABNORMAL HIGH (ref 70–99)
Glucose-Capillary: 88 mg/dL (ref 70–99)
Glucose-Capillary: 73 mg/dL (ref 70–99)
Glucose-Capillary: 85 mg/dL (ref 70–99)
Glucose-Capillary: 75 mg/dL (ref 70–99)
Glucose-Capillary: 75 mg/dL (ref 70–99)
Glucose-Capillary: 79 mg/dL (ref 70–99)
Glucose-Capillary: 100 mg/dL — ABNORMAL HIGH (ref 70–99)
Glucose-Capillary: 98 mg/dL (ref 70–99)

## 2022-07-28 LAB — TYPE AND SCREEN
ABO/RH(D): O POS
Antibody Screen: NEGATIVE

## 2022-07-28 LAB — RPR: RPR Ser Ql: NONREACTIVE

## 2022-07-28 MED ORDER — FENTANYL CITRATE (PF) 100 MCG/2ML IJ SOLN: 100.0000 ug | INTRAMUSCULAR | Status: DC | PRN

## 2022-07-28 MED ORDER — SOD CITRATE-CITRIC ACID 500-334 MG/5ML PO SOLN: 30.0000 mL | ORAL | Status: DC | PRN

## 2022-07-28 MED ORDER — OXYCODONE-ACETAMINOPHEN 5-325 MG PO TABS: 1.0000 | ORAL_TABLET | ORAL | Status: DC | PRN

## 2022-07-28 MED ORDER — OXYTOCIN BOLUS FROM INFUSION
333.0000 mL | Freq: Once | INTRAVENOUS | Status: AC
  Administered 2022-07-28: 333 mL via INTRAVENOUS

## 2022-07-28 MED ORDER — TERBUTALINE SULFATE 1 MG/ML IJ SOLN: 0.2500 mg | Freq: Once | INTRAMUSCULAR | Status: DC | PRN

## 2022-07-28 MED ORDER — PHENYLEPHRINE 80 MCG/ML (10ML) SYRINGE FOR IV PUSH (FOR BLOOD PRESSURE SUPPORT): 80.0000 ug | PREFILLED_SYRINGE | INTRAVENOUS | Status: DC | PRN

## 2022-07-28 MED ORDER — DEXTROSE 50 % IV SOLN: 0.0000 mL | INTRAVENOUS | Status: DC | PRN

## 2022-07-28 MED ORDER — DEXTROSE IN LACTATED RINGERS 5 % IV SOLN: INTRAVENOUS | Status: DC

## 2022-07-28 MED ORDER — EPHEDRINE 5 MG/ML INJ: 10.0000 mg | INTRAVENOUS | Status: DC | PRN

## 2022-07-28 MED ORDER — MISOPROSTOL 25 MCG QUARTER TABLET
25.0000 ug | ORAL_TABLET | Freq: Once | ORAL | Status: AC
  Administered 2022-07-28: 25 ug via VAGINAL
  Filled 2022-07-28: qty 1

## 2022-07-28 MED ORDER — OXYTOCIN-SODIUM CHLORIDE 30-0.9 UT/500ML-% IV SOLN
1.0000 m[IU]/min | INTRAVENOUS | Status: DC
  Administered 2022-07-28: 2 m[IU]/min via INTRAVENOUS

## 2022-07-28 MED ORDER — LACTATED RINGERS IV SOLN: INTRAVENOUS | Status: DC

## 2022-07-28 MED ORDER — LACTATED RINGERS AMNIOINFUSION: INTRAVENOUS | Status: DC

## 2022-07-28 MED ORDER — DIPHENHYDRAMINE HCL 50 MG/ML IJ SOLN: 12.5000 mg | INTRAMUSCULAR | Status: DC | PRN

## 2022-07-28 MED ORDER — FENTANYL-BUPIVACAINE-NACL 0.5-0.125-0.9 MG/250ML-% EP SOLN
12.0000 mL/h | EPIDURAL | Status: DC | PRN
  Filled 2022-07-28: qty 250

## 2022-07-28 MED ORDER — OXYCODONE-ACETAMINOPHEN 5-325 MG PO TABS: 2.0000 | ORAL_TABLET | ORAL | Status: DC | PRN

## 2022-07-28 MED ORDER — MISOPROSTOL 25 MCG QUARTER TABLET
25.0000 ug | ORAL_TABLET | ORAL | Status: DC | PRN
  Administered 2022-07-28: 25 ug via VAGINAL
  Filled 2022-07-28: qty 1

## 2022-07-28 MED ORDER — ACETAMINOPHEN 325 MG PO TABS: 650.0000 mg | ORAL_TABLET | ORAL | Status: DC | PRN

## 2022-07-28 MED ORDER — LIDOCAINE HCL (PF) 1 % IJ SOLN
30.0000 mL | INTRAMUSCULAR | Status: AC | PRN
  Administered 2022-07-28: 30 mL via SUBCUTANEOUS
  Filled 2022-07-28: qty 30

## 2022-07-28 MED ORDER — MISOPROSTOL 25 MCG QUARTER TABLET
25.0000 ug | ORAL_TABLET | Freq: Once | ORAL | Status: AC
  Administered 2022-07-28: 25 ug via ORAL
  Filled 2022-07-28: qty 1

## 2022-07-28 MED ORDER — FENTANYL-BUPIVACAINE-NACL 0.5-0.125-0.9 MG/250ML-% EP SOLN
EPIDURAL | Status: DC | PRN
  Administered 2022-07-28: 12 mL/h via EPIDURAL

## 2022-07-28 MED ORDER — OXYTOCIN-SODIUM CHLORIDE 30-0.9 UT/500ML-% IV SOLN
2.5000 [IU]/h | INTRAVENOUS | Status: DC
  Administered 2022-07-28: 2.5 [IU]/h via INTRAVENOUS
  Filled 2022-07-28: qty 500

## 2022-07-28 MED ORDER — INSULIN REGULAR(HUMAN) IN NACL 100-0.9 UT/100ML-% IV SOLN
INTRAVENOUS | Status: DC
  Administered 2022-07-28: 1.5 [IU]/h via INTRAVENOUS
  Filled 2022-07-28: qty 100

## 2022-07-28 MED ORDER — LACTATED RINGERS IV SOLN: 500.0000 mL | INTRAVENOUS | Status: DC | PRN

## 2022-07-28 MED ORDER — LIDOCAINE HCL (PF) 1 % IJ SOLN
INTRAMUSCULAR | Status: DC | PRN
  Administered 2022-07-28: 5 mL via EPIDURAL

## 2022-07-28 MED ORDER — LACTATED RINGERS IV SOLN: 500.0000 mL | Freq: Once | INTRAVENOUS | Status: DC

## 2022-07-28 MED ORDER — ONDANSETRON HCL 4 MG/2ML IJ SOLN
4.0000 mg | Freq: Four times a day (QID) | INTRAMUSCULAR | Status: DC | PRN
  Administered 2022-07-28: 4 mg via INTRAVENOUS
  Filled 2022-07-28: qty 2

## 2022-07-28 NOTE — Progress Notes (Signed)
Labor Progress Note Cybil Elmer is a 22 y.o. G1P0000 at [redacted]w[redacted]d presented for IOL.   S: No acute concerns.   O:  BP 128/89   Pulse 93   Temp 98.4 F (36.9 C) (Oral)   Resp 18   Ht 5\' 1"  (1.549 m)   Wt 87 kg   LMP 10/09/2021   SpO2 99%   BMI 36.24 kg/m  EFM: 130bpm/moderate/+accels, variable  CVE: Dilation: 10 Dilation Complete Date: 07/28/22 Dilation Complete Time: 1801 Effacement (%): 90 Station: Plus 1 Presentation: Vertex Exam by:: stone rnc  A&P: 22 y.o. G1P0000 [redacted]w[redacted]d here for IOL 2/2 A2GDM #Labor: Started pushing at 69PM. She is making good progress.  #Pain: Epidural #FWB: Cat II #GBS negative  A1GDM -Continue endotool  Nikholas Geffre Autry-Lott, DO 10:08 PM

## 2022-07-28 NOTE — Progress Notes (Addendum)
CBGs consistently elevated. The patient states she has not eaten since 11PM which was a sugary meal. We will start entool.   Kathryn Fee, DO OB Fellow, [place] for [facility] 07/28/2022, 6:15 AM

## 2022-07-28 NOTE — Progress Notes (Signed)
Labor Progress Note Kathryn Doyle is a 22 y.o. G1P0000 at [redacted]w[redacted]d presented for IOL for GDM  S:  Starting to feel more ctx. Declines pain meds at this time.  O:  BP 114/70   Pulse 88   Temp 98 F (36.7 C) (Oral)   Resp 16   Ht 5\' 1"  (1.549 m)   Wt 87 kg   LMP 10/09/2021   BMI 36.24 kg/m  EFM: baseline 135 bpm/ mod variability/ + accels/ no decels  Toco/IUPC: 1.5-3 SVE: Dilation: 3.5 Effacement (%): 70, 80 Station: -2 Presentation: Vertex Exam by:: stone rnc Pitocin: 2 mu/min  A/P: 22 y.o. G1P0000 [redacted]w[redacted]d  1. Labor: latent 2. FWB: Cat I 3. Pain: analgesia/anesthesia/NO prn 4. A1GDM: stable on Endotool  Continue Pitocin titration. Anticipate labor progress and SVD.  Julianne Handler, CNM 1:08 PM

## 2022-07-28 NOTE — Inpatient Diabetes Management (Signed)
Inpatient Diabetes Program Recommendations  Diabetes Treatment Program Recommendations  ADA Standards of Care Diabetes in Pregnancy Target Glucose Ranges:  Fasting: 70 - 95 mg/dL 1 hr postprandial: Less than 140mg /dL (from first bite of meal) 2 hr postprandial: Less than 120 mg/dL (from first bite of meal)     Latest Reference Range & Units 07/28/22 02:50 07/28/22 03:44 07/28/22 05:45 07/28/22 06:46 07/28/22 07:49 07/28/22 08:47  Glucose-Capillary 70 - 99 mg/dL 136 (H) 123 (H) 122 (H) 130 (H) 135 (H) 113 (H)  (H): Data is abnormally high Review of Glycemic Control  Diabetes history: GDM, [redacted]W[redacted]D Outpatient Diabetes medications: None Current orders for Inpatient glycemic control: IV insulin  Inpatient Diabetes Program Recommendations:    Insulin: Agree with using IV insulin during labor. Anticipate glucose to normalize following delivery and IV insulin being stopped. Follow delivery, would recommend to order fasting CBG.  NOTE: Noted consult for Diabetes Coordinator. Diabetes Coordinator is not on campus over the weekend but available by pager from 8am to 5pm for questions or concerns. Patient admitted on 07/28/22 for IOL, [redacted]W[redacted]D gestation, with hx of GDM. Patient was started on IV insulin at 6:46 am today with glucose 130 mg/dl. Agree with using IV insulin for glucose control during labor. Anticipate that once patient delivers she will no longer need insulin and IV insulin will be stopped. Would recommend ordering fasting CBG following delivery.  Thanks, Barnie Alderman, RN, MSN, [place] Diabetes Coordinator Inpatient Diabetes Program [phone] (Team Pager from 8am to [place])   Thanks, Barnie Alderman, RN, MSN, [place] Diabetes Coordinator Inpatient Diabetes Program [phone] (Team Pager from 8am to 5pm)

## 2022-07-28 NOTE — Progress Notes (Signed)
Labor Progress Note Jessiana Eichorn is a 22 y.o. G1P0000 at [redacted]w[redacted]d presented for IOL for GDM. S/p SROM @1330 .  S:  Comfortable with epidural. No complaints.   O:  BP 122/83   Pulse 91   Temp 98 F (36.7 C) (Oral)   Resp 16   Ht 5\' 1"  (1.549 m)   Wt 87 kg   LMP 10/09/2021   SpO2 99%   BMI 36.24 kg/m  EFM: baseline 135 bpm/ mod variability/ no accels/ variable, late decels  Toco/IUPC: 1-5 SVE: 6/90/-1 Pitocin: 6 mu/min  A/P: 22 y.o. G1P0000 [redacted]w[redacted]d  1. Labor: active, progressing well 2. FWB: Cat II 3. Pain: epidural 4. GDM: stable on Endotool  IUPC placed and will start amnioinfusion.  Anticipate labor progress and SVD.  Julianne Handler, CNM 7:04 PM

## 2022-07-28 NOTE — Discharge Summary (Signed)
     Postpartum Discharge Summary  Date of Service updated***     Patient Name: Kathryn Doyle DOB: [birth year] MRN: [id number]  Date of admission: 07/28/2022 Delivery date:07/28/2022  Delivering provider: Gerlene Fee  Date of discharge: 07/28/2022  Admitting diagnosis: GDM, class A1 [O24.410] Intrauterine pregnancy: [redacted]w[redacted]d     Secondary diagnosis:  Principal Problem:   GDM, class A1  Additional problems: ***    Discharge diagnosis: {DX.:23714}                                              Post partum procedures:{Postpartum procedures:23558} Augmentation: 123456 Complications: {OB Labor/Delivery Complications:20784}  Hospital course: {Courses:23701}  Magnesium Sulfate received: {Mag received:30440022} BMZ received: {BMZ received:30440023} Rhophylac:{Rhophylac received:30440032} IY:1265226 T-DaP:{Tdap:23962} Flu: WG:1132360 Transfusion:{Transfusion received:30440034}  Physical exam  Vitals:   07/28/22 1901 07/28/22 1922 07/28/22 2115 07/28/22 2215  BP: 128/89   107/69  Pulse: 93   (!) 121  Resp:  18    Temp:  98 F (36.7 C) 98.4 F (36.9 C)   TempSrc:  Oral Oral   SpO2:      Weight:      Height:       General: {Exam; general:21111117} Lochia: {Desc; appropriate/inappropriate:30686::"appropriate"} Uterine Fundus: {Desc; firm/soft:30687} Incision: {Exam; incision:21111123} DVT Evaluation: {Exam; dvt:2111122} Labs: Lab Results  Component Value Date   WBC 12.2 (H) 07/28/2022   HGB 12.3 07/28/2022   HCT 35.2 (L) 07/28/2022   MCV 90.0 07/28/2022   PLT 311 07/28/2022      Latest Ref Rng & Units 06/20/2022    1:22 PM  CMP  Glucose 70 - 99 mg/dL 101   BUN 6 - 20 mg/dL 7   Creatinine 0.44 - 1.00 mg/dL 0.55   Sodium 135 - 145 mmol/L 134   Potassium 3.5 - 5.1 mmol/L 3.8   Chloride 98 - 111 mmol/L 101   CO2 22 - 32 mmol/L 22   Calcium 8.9 - 10.3 mg/dL 8.6   Total Protein 6.5 - 8.1 g/dL 6.8   Total Bilirubin 0.3 - 1.2 mg/dL 0.3    Alkaline Phos 38 - 126 U/L 107   AST 15 - 41 U/L 26   ALT 0 - 44 U/L 40    Edinburgh Score:     No data to display           After visit meds:  Allergies as of 07/28/2022   No Known Allergies   Med Rec must be completed prior to using this [facility]***        Discharge home in stable condition Infant Feeding: {Baby feeding:23562} Infant Disposition:{CHL IP OB HOME WITH DX:[id number] Discharge instruction: per After Visit Summary and Postpartum booklet. Activity: Advance as tolerated. Pelvic rest for 6 weeks.  Diet: {OB BY:630183 Future Appointments: Future Appointments  Date Time Provider [place]  08/01/2022 10:55 AM Griffin Basil, MD [place] None  08/09/2022 11:15 AM Chancy Milroy, MD [place] None   Follow up Visit:   Please schedule this patient for a {Visit type:23955} postpartum visit in {Postpartum visit:23953} with the following provider: {Provider type:23954}. Additional Postpartum F/U:{PP Procedure:23957}  {Risk Q000111Q pregnancy complicated by: 0000000 Delivery mode:  Vaginal, Spontaneous  Anticipated Birth Control:  {Birth Control:23956}   07/28/2022 Simone Autry-Lott, DO

## 2022-07-28 NOTE — Anesthesia Preprocedure Evaluation (Addendum)
Anesthesia Evaluation  Patient identified by MRN, date of birth, ID band Patient awake    Reviewed: Allergy & Precautions, NPO status , Patient's Chart, lab work & pertinent test results  Airway Mallampati: II  TM Distance: >3 FB Neck ROM: Full    Dental no notable dental hx.    Pulmonary neg pulmonary ROS   Pulmonary exam normal breath sounds clear to auscultation       Cardiovascular Exercise Tolerance: Good Normal cardiovascular exam Rhythm:Regular Rate:Normal     Neuro/Psych  negative psych ROS   GI/Hepatic negative GI ROS, Neg liver ROS,,,  Endo/Other  diabetes, Gestational    Renal/GU negative Renal ROS     Musculoskeletal   Abdominal  (+) + obese (BMI 36.24)  Peds  Hematology Lab Results      Component                Value               Date                      WBC                      12.2 (H)            07/28/2022                HGB                      12.3                07/28/2022                HCT                      35.2 (L)            07/28/2022                MCV                      90.0                07/28/2022                PLT                      311                 07/28/2022              Anesthesia Other Findings   Reproductive/Obstetrics (+) Pregnancy                             Anesthesia Physical Anesthesia Plan  ASA: 3  Anesthesia Plan: Epidural   Post-op Pain Management:    Induction:   PONV Risk Score and Plan: Treatment may vary due to age or medical condition  Airway Management Planned:   Additional Equipment: None  Intra-op Plan:   Post-operative Plan:   Informed Consent: I have reviewed the patients History and Physical, chart, labs and discussed the procedure including the risks, benefits and alternatives for the proposed anesthesia with the patient or authorized representative who has indicated his/her understanding and acceptance.        Plan Discussed with:   Anesthesia Plan Comments: (39.1  wk primagravida w BMI 36.2 and gDm for LEA)        Anesthesia Quick Evaluation

## 2022-07-28 NOTE — Anesthesia Procedure Notes (Signed)
Epidural Patient location during procedure: OB Start time: 07/28/2022 1:52 PM End time: 07/28/2022 2:03 PM  Staffing Anesthesiologist: Barnet Glasgow, MD Performed: anesthesiologist   Preanesthetic Checklist Completed: patient identified, IV checked, site marked, risks and benefits discussed, surgical consent, monitors and equipment checked, pre-op evaluation and timeout performed  Epidural Patient position: sitting Prep: DuraPrep and site prepped and draped Patient monitoring: continuous pulse ox and blood pressure Approach: midline Injection technique: LOR air  Needle:  Needle type: Tuohy  Needle gauge: 17 G Needle length: 9 cm and 9 Needle insertion depth: 5 cm cm Catheter type: closed end flexible Catheter size: 19 Gauge Catheter at skin depth: 10 cm Test dose: negative  Assessment Events: blood not aspirated, no cerebrospinal fluid, injection not painful, no injection resistance, no paresthesia and negative IV test  Additional Notes Patient identified. Risks/Benefits/Options discussed with patient including but not limited to bleeding, infection, nerve damage, paralysis, failed block, incomplete pain control, headache, blood pressure changes, nausea, vomiting, reactions to medication both or allergic, itching and postpartum back pain. Confirmed with bedside nurse the patient's most recent platelet count. Confirmed with patient that they are not currently taking any anticoagulation, have any bleeding history or any family history of bleeding disorders. Patient expressed understanding and wished to proceed. All questions were answered. Sterile technique was used throughout the entire procedure. Please see nursing notes for vital signs. Test dose was given through epidural needle and negative prior to continuing to dose epidural or start infusion. Warning signs of high block given to the patient including shortness of breath, tingling/numbness in hands, complete motor block, or  any concerning symptoms with instructions to call for help. Patient was given instructions on fall risk and not to get out of bed. All questions and concerns addressed with instructions to call with any issues.  Attempt (S) . Patient tolerated procedure well.

## 2022-07-28 NOTE — Progress Notes (Signed)
Labor Progress Note Kathryn Doyle is a 22 y.o. G1P0000 at [redacted]w[redacted]d presented for IOL.   S: No acute concerns  O:  BP (!) 100/57 (BP Location: Right Arm)   Pulse 94   Temp 98.1 F (36.7 C) (Oral)   Resp 16   Ht 5\' 1"  (1.549 m)   Wt 87 kg   LMP 10/09/2021   BMI 36.24 kg/m  EFM: 135bpm/moderate/+accels, no decels  CVE: Dilation: 1.5 Effacement (%): 60 Station: -2 Presentation: Vertex Exam by:: Dr Janus Molder  A&P: 22 y.o. G1P0000 [redacted]w[redacted]d here for IOL 2/2 A1GDM.  #Labor: Progressing well. FB placed with additional 25 mcg vaginal cytotec.  #Pain: Maternally supported, planning for epidural #FWB: Cat I  #GBS negative  A1GDM CBGs consistently elevated since admission without meals.  -Continue endotool  Kathryn Birky Autry-Lott, DO 7:39 AM

## 2022-07-29 LAB — CBC
HCT: 33.3 % — ABNORMAL LOW (ref 36.0–46.0)
Hemoglobin: 11.7 g/dL — ABNORMAL LOW (ref 12.0–15.0)
MCH: 31.9 pg (ref 26.0–34.0)
MCHC: 35.1 g/dL (ref 30.0–36.0)
MCV: 90.7 fL (ref 80.0–100.0)
Platelets: 264 10*3/uL (ref 150–400)
RBC: 3.67 MIL/uL — ABNORMAL LOW (ref 3.87–5.11)
RDW: 13.4 % (ref 11.5–15.5)
WBC: 17.9 10*3/uL — ABNORMAL HIGH (ref 4.0–10.5)
nRBC: 0 % (ref 0.0–0.2)

## 2022-07-29 LAB — GLUCOSE, CAPILLARY: Glucose-Capillary: 187 mg/dL — ABNORMAL HIGH (ref 70–99)

## 2022-07-29 MED ORDER — ACETAMINOPHEN 325 MG PO TABS: 650.0000 mg | ORAL_TABLET | ORAL | Status: DC | PRN

## 2022-07-29 MED ORDER — ZOLPIDEM TARTRATE 5 MG PO TABS: 5.0000 mg | ORAL_TABLET | Freq: Every evening | ORAL | Status: DC | PRN

## 2022-07-29 MED ORDER — DIPHENHYDRAMINE HCL 25 MG PO CAPS: 25.0000 mg | ORAL_CAPSULE | Freq: Four times a day (QID) | ORAL | Status: DC | PRN

## 2022-07-29 MED ORDER — DIBUCAINE (PERIANAL) 1 % EX OINT: 1.0000 | TOPICAL_OINTMENT | CUTANEOUS | Status: DC | PRN

## 2022-07-29 MED ORDER — SENNOSIDES-DOCUSATE SODIUM 8.6-50 MG PO TABS
2.0000 | ORAL_TABLET | Freq: Every day | ORAL | Status: DC
  Administered 2022-07-29 – 2022-07-30 (×2): 2 via ORAL
  Filled 2022-07-29 (×2): qty 2

## 2022-07-29 MED ORDER — TETANUS-DIPHTH-ACELL PERTUSSIS 5-2.5-18.5 LF-MCG/0.5 IM SUSY: 0.5000 mL | PREFILLED_SYRINGE | Freq: Once | INTRAMUSCULAR | Status: DC

## 2022-07-29 MED ORDER — PRENATAL MULTIVITAMIN CH
1.0000 | ORAL_TABLET | Freq: Every day | ORAL | Status: DC
  Administered 2022-07-29 – 2022-07-30 (×2): 1 via ORAL
  Filled 2022-07-29 (×2): qty 1

## 2022-07-29 MED ORDER — ONDANSETRON HCL 4 MG/2ML IJ SOLN: 4.0000 mg | INTRAMUSCULAR | Status: DC | PRN

## 2022-07-29 MED ORDER — COCONUT OIL OIL: 1.0000 | TOPICAL_OIL | Status: DC | PRN

## 2022-07-29 MED ORDER — ONDANSETRON HCL 4 MG PO TABS: 4.0000 mg | ORAL_TABLET | ORAL | Status: DC | PRN

## 2022-07-29 MED ORDER — WITCH HAZEL-GLYCERIN EX PADS: 1.0000 | MEDICATED_PAD | CUTANEOUS | Status: DC | PRN

## 2022-07-29 MED ORDER — IBUPROFEN 600 MG PO TABS
600.0000 mg | ORAL_TABLET | Freq: Four times a day (QID) | ORAL | Status: DC
  Administered 2022-07-29 – 2022-07-30 (×6): 600 mg via ORAL
  Filled 2022-07-29 (×6): qty 1

## 2022-07-29 MED ORDER — BENZOCAINE-MENTHOL 20-0.5 % EX AERO
1.0000 | INHALATION_SPRAY | CUTANEOUS | Status: DC | PRN
  Filled 2022-07-29: qty 56

## 2022-07-29 MED ORDER — SIMETHICONE 80 MG PO CHEW: 80.0000 mg | CHEWABLE_TABLET | ORAL | Status: DC | PRN

## 2022-07-29 NOTE — Progress Notes (Signed)
Postpartum Day 1 Subjective: no complaints, up ad lib, voiding, tolerating PO, and + flatus  Objective: Blood pressure (!) 115/54, pulse 97, temperature 97.8 F (36.6 C), temperature source Oral, resp. rate 16, height 5\' 1"  (1.549 m), weight 191 lb 12.8 oz (87 kg), last menstrual period 10/09/2021, SpO2 98 %, unknown if currently breastfeeding.  Physical Exam:  General: alert, cooperative, appears stated age, and fatigued Lochia: appropriate Uterine Fundus: firm Incision: N/A DVT Evaluation: No evidence of DVT seen on physical exam.  Recent Labs    07/28/22 0218 07/29/22 0438  HGB 12.3 11.7*  HCT 35.2* 33.3*    Assessment/Plan: Plan for discharge tomorrow   LOS: 1 day   Gabriel Carina, CNM 07/29/2022, 4:04 PM

## 2022-07-29 NOTE — Progress Notes (Addendum)
Upon AM report patient had ambulated and voided independently. During conversation and assessment, patient denied voiding on our shift.  Patient states she has not hydrated during the day. Encouraged and educated more hydration in order to promote voiding. Patient refused attempts to void. Bladder scanned patient and results were >900cc. Discussed the risks of bladder infection, increased bleeding and benefits of pain control while voiding regularly. Got patient up to void and she emptied her bladder to apparent satisfaction and approximated volume. Continued and repeated education on getting up to the bathroom every 2-3 hours for benefits listed above. Talbert Forest, RN

## 2022-07-29 NOTE — Anesthesia Postprocedure Evaluation (Signed)
Anesthesia Post Note  Patient: Kathryn Doyle  Procedure(s) Performed: AN AD [place]     Patient location during evaluation: Mother Baby Anesthesia Type: Epidural Level of consciousness: awake and alert and oriented Pain management: satisfactory to patient Vital Signs Assessment: post-procedure vital signs reviewed and stable Respiratory status: respiratory function stable Cardiovascular status: stable Postop Assessment: no headache, no backache, epidural receding, patient able to bend at knees, no signs of nausea or vomiting, adequate PO intake and able to ambulate Anesthetic complications: no   No notable events documented.  Last Vitals:  Vitals:   07/29/22 0450 07/29/22 0845  BP: (!) 122/58 (!) 111/54  Pulse: (!) 105 98  Resp: 17 14  Temp: 36.7 C 36.7 C  SpO2: 98%     Last Pain:  Vitals:   07/29/22 0845  TempSrc: Oral  PainSc: 5    Pain Goal:                   Kathryn Doyle

## 2022-07-30 ENCOUNTER — Other Ambulatory Visit (HOSPITAL_COMMUNITY): Payer: Self-pay

## 2022-07-30 LAB — HEMOGLOBIN A1C
Hgb A1c MFr Bld: 5.2 % (ref 4.8–5.6)
Mean Plasma Glucose: 103 mg/dL

## 2022-07-30 MED ORDER — POLYETHYLENE GLYCOL 3350 17 GM/SCOOP PO POWD
17.0000 g | Freq: Every day | ORAL | 1 refills | Status: AC | PRN
  Filled 2022-07-30: qty 510, 30d supply, fill #0

## 2022-07-30 MED ORDER — ACETAMINOPHEN 500 MG PO TABS
1000.0000 mg | ORAL_TABLET | Freq: Four times a day (QID) | ORAL | 0 refills | Status: AC | PRN
  Filled 2022-07-30: qty 60, 8d supply, fill #0

## 2022-07-30 MED ORDER — IBUPROFEN 600 MG PO TABS
600.0000 mg | ORAL_TABLET | Freq: Four times a day (QID) | ORAL | 0 refills | Status: AC | PRN
  Filled 2022-07-30 (×2): qty 60, 15d supply, fill #0

## 2022-08-01 ENCOUNTER — Encounter: Payer: Medicaid Other | Admitting: Obstetrics and Gynecology

## 2022-08-09 ENCOUNTER — Encounter: Payer: Medicaid Other | Admitting: Obstetrics and Gynecology

## 2022-08-09 ENCOUNTER — Telehealth (HOSPITAL_COMMUNITY): Payer: Self-pay | Admitting: *Deleted

## 2022-08-09 NOTE — Telephone Encounter (Signed)
Hospital Discharge Follow-Up Call:  Patient reports that she is well overall.  She has noticed an odor from her perineum where she got stitches to repair a laceration.  Advised patient to call her prenatal provider and ask to be checked.  EPDS today was 1 and she endorses this accurately reflects that she is doing well emotionally.  Patient says that baby is well and she has no concerns about baby's health.  She reports that baby sleeps in a bassinet.  Reviewed ABCs of Safe Sleep.

## 2022-09-11 ENCOUNTER — Ambulatory Visit (INDEPENDENT_AMBULATORY_CARE_PROVIDER_SITE_OTHER): Payer: Medicaid Other | Admitting: Obstetrics & Gynecology

## 2022-09-11 ENCOUNTER — Other Ambulatory Visit: Payer: Medicaid Other

## 2022-09-11 ENCOUNTER — Encounter: Payer: Self-pay | Admitting: Obstetrics & Gynecology

## 2022-09-11 NOTE — Progress Notes (Signed)
Post Partum Visit Note  Somaly Marteney is a 22 y.o. G47P1001 female who presents for a postpartum visit. She is 6 weeks postpartum following a normal spontaneous vaginal delivery.  I have fully reviewed the prenatal and intrapartum course. The delivery was at 39.1 gestational weeks.  Anesthesia: epidural. Postpartum course has been unremarkable. Baby is doing well. Baby is feeding by bottle - Similac Advance. Bleeding thin lochia. Bowel function is normal. Bladder function is normal. Patient is not sexually active. Contraception method is condoms. Postpartum depression screening: negative.   Upstream - 09/11/22 1610       Pregnancy Intention Screening   Does the patient want to become pregnant in the next year? Yes    Does the patient's partner want to become pregnant in the next year? Yes    Would the patient like to discuss contraceptive options today? No      Contraception Wrap Up   Current Method Female Condom            The pregnancy intention screening data noted above was reviewed. Potential methods of contraception were discussed. The patient elected to proceed with No data recorded.   Edinburgh Postnatal Depression Scale - 09/11/22 0917       Edinburgh Postnatal Depression Scale:  In the Past 7 Days   I have been able to laugh and see the funny side of things. 0    I have looked forward with enjoyment to things. 0    I have blamed myself unnecessarily when things went wrong. 0    I have been anxious or worried for no good reason. 0    I have felt scared or panicky for no good reason. 0    Things have been getting on top of me. 0    I have been so unhappy that I have had difficulty sleeping. 0    I have felt sad or miserable. 0    I have been so unhappy that I have been crying. 0    The thought of harming myself has occurred to me. 0    Edinburgh Postnatal Depression Scale Total 0             Health Maintenance Due  Topic Date Due   COVID-19 Vaccine (1) Never  done   PAP-Cervical Cytology Screening  07/17/2022   PAP SMEAR-Modifier  07/17/2022    The following portions of the patient's history were reviewed and updated as appropriate: allergies, current medications, past family history, past medical history, past social history, past surgical history, and problem list.  Review of Systems Pertinent items are noted in HPI.  Objective:  BP 115/67   Pulse 80   Ht 5' (1.524 m)   Wt 177 lb (80.3 kg)   LMP 09/09/2022 (Exact Date)   Breastfeeding No   BMI 34.57 kg/m    General:  alert, cooperative, and no distress   Breasts:  not indicated  Lungs:   Heart:  regular rate and rhythm  Abdomen: soft, non-tender; bowel sounds normal; no masses,  no organomegaly   Wound   GU exam:  not indicated       Assessment:    1. Postpartum care following vaginal delivery [Z39.2] Doing well. F/u for GDM today. Ovarian cyst was not visualized on f/u scans   normal postpartum exam.   Plan:   Essential components of care per ACOG recommendations:  1.  Mood and well being: Patient with negative depression screening today. Reviewed local resources  for support.  - Patient tobacco use? No.   - hx of drug use? No.    2. Infant care and feeding:  -Patient currently breastmilk feeding? No.  -Social determinants of health (SDOH) reviewed in EPIC. No concerns  3. Sexuality, contraception and birth spacing - Patient does not want a pregnancy in the next year.  Desired family size is 2 children.  - Reviewed reproductive life planning. Reviewed contraceptive methods based on pt preferences and effectiveness.  Patient desired Female Condom today.   - Discussed birth spacing of 18 months  4. Sleep and fatigue -Encouraged family/partner/community support of 4 hrs of uninterrupted sleep to help with mood and fatigue  5. Physical Recovery  - Discussed patients delivery and complications. She describes her labor as good. - Patient had a Vaginal, no problems at  delivery. Patient had a 1st degree laceration. Perineal healing reviewed. Patient expressed understanding - Patient has urinary incontinence? No. - Patient is safe to resume physical and sexual activity  6.  Health Maintenance - HM due items addressed Yes - Last pap smear No results found for: "DIAGPAP" Pap smear not done at today's visit.  -Breast Cancer screening indicated? No.   7. Chronic Disease/Pregnancy Condition follow up: Gestational Diabetes RTC for annual in 6 months - PCP follow up  Adam Phenix, MD  Center for [facility], [facility]

## 2022-09-12 LAB — GLUCOSE TOLERANCE, 2 HOURS
Glucose, 2 hour: 166 mg/dL — ABNORMAL HIGH (ref 70–139)
Glucose, GTT - Fasting: 83 mg/dL (ref 70–99)

## 2022-09-17 ENCOUNTER — Encounter: Payer: Self-pay | Admitting: Obstetrics & Gynecology

## 2022-10-23 ENCOUNTER — Ambulatory Visit: Payer: Medicaid Other

## 2022-10-23 NOTE — Progress Notes (Deleted)
   CC: New patient visit  HPI:  Kathryn Doyle is a 22 y.o. female with past medical history of gestational DM and ovarian cyst that presents for new patient visit.    Past medical history includes: Patient denies past medical history of HTN, DM, CHF, MI, DVT, stroke, or cancer.   Family history includes: Patient denies family history of HTN, DM, CHF, MI, DVT, stroke, or cancer.   Past surgical history:  Past social history: Patient lives at home with ***. Patient works as a ***. Patient denies current or prior tobacco use, alcohol use, or recreational drug use.   Medications:   Allergies as of 10/23/2022   No Known Allergies      Medication List        Accurate as of October 23, 2022  7:12 AM. If you have any questions, ask your nurse or doctor.          acetaminophen 500 MG tablet Commonly known as: TYLENOL Take 2 tablets (1,000 mg total) by mouth every 6 (six) hours as needed for mild pain or moderate pain.   ibuprofen 600 MG tablet Commonly known as: ADVIL Take 1 tablet (600 mg total) by mouth every 6 (six) hours as needed for moderate pain, mild pain or cramping.   polyethylene glycol powder 17 GM/SCOOP powder Commonly known as: GLYCOLAX/MIRALAX Mix 17 g in beverage and take by mouth daily as needed.         Past Medical History:  Diagnosis Date   Episodic tension-type headache, not intractable 07/01/2018   Gestational diabetes    Migraine without aura and without status migrainosus, not intractable 07/01/2018   Review of Systems:  per HPI.   Physical Exam: *** There were no vitals filed for this visit. *** Constitutional: Well-developed, well-nourished, appears comfortable  HENT: Normocephalic and atraumatic.  Eyes: EOM are normal. PERRL.  Neck: Normal range of motion.  Cardiovascular: Regular rate, regular rhythm. No murmurs, rubs, or gallops. Normal radial and PT pulses bilaterally. No LE edema.  Pulmonary: Normal respiratory effort. No  wheezes, rales, or rhonchi.   Abdominal: Soft. Non-distended. No tenderness. Normal bowel sounds.  Musculoskeletal: Normal range of motion.     Neurological: Alert and oriented to person, place, and time. Non-focal. Skin: warm and dry.    Assessment & Plan:   Gestational DM Patient has a history of gestational DM, previously treated w/ ***. She gave birth in 07/2022. She is not currently on diabetic medications. Patient denies polyuria, polydipsia, fatigue. A1c was 5.2% 2 months ago. A1c today ***.   Plan: - Continue dietary modification***    2.   Plan: - Continue   3.   Plan: - Continue   5. Health Screening: ***pap smear -  - Medication refill? ***  -   See Encounters Tab for problem based charting.  No problem-specific Assessment & Plan notes found for this encounter.    Patient seen with Dr. Amadeo Garnet

## 2023-07-08 ENCOUNTER — Other Ambulatory Visit (HOSPITAL_COMMUNITY)
Admission: RE | Admit: 2023-07-08 | Discharge: 2023-07-08 | Disposition: A | Discharge status: Home / Self Care | Payer: Medicaid Other | Source: Ambulatory Visit | Attending: Obstetrics & Gynecology | Admitting: Obstetrics & Gynecology

## 2023-07-08 ENCOUNTER — Ambulatory Visit: Payer: Medicaid Other | Admitting: Obstetrics & Gynecology

## 2023-07-08 VITALS — BP 104/71 | HR 93 | Ht 61.0 in | Wt 197.2 lb

## 2023-07-08 DIAGNOSIS — N898 Other specified noninflammatory disorders of vagina: Secondary | ICD-10-CM

## 2023-07-08 DIAGNOSIS — L678 Other hair color and hair shaft abnormalities: Secondary | ICD-10-CM

## 2023-07-08 DIAGNOSIS — E669 Obesity, unspecified: Secondary | ICD-10-CM

## 2023-07-08 DIAGNOSIS — N926 Irregular menstruation, unspecified: Secondary | ICD-10-CM | POA: Diagnosis not present

## 2023-07-08 DIAGNOSIS — R635 Abnormal weight gain: Secondary | ICD-10-CM

## 2023-07-08 DIAGNOSIS — L700 Acne vulgaris: Secondary | ICD-10-CM

## 2023-07-08 NOTE — Progress Notes (Signed)
    GYNECOLOGY PROGRESS NOTE  Subjective:    Patient ID: Kathryn Doyle, female    DOB: 02-22-2001, 23 y.o.   MRN: [id number]  HPI  Patient is a 23 y.o. G107P1727 (71 month old son) here because of weight gain, facial hair for at least a year and acne. She has been plucking facial hair prior to pregnancy, usually weekly. She denies unwanted hair in other areas (abdomen, breast). She has irregular periods, skips, LMP was 04/2024. She uses condoms for contraception and has had several negative UPTs including a visit to Urgent Care. Her last occasion of IC was about a month ago.  She also reports a recent yellowish, odorous vaginal odor.  She had GDM and has seen her primary care. A1C was 5.3 and she was given reasurance.  She had a normal pap smear 03/2023.  The following portions of the patient's history were reviewed and updated as appropriate: allergies, current medications, past family history, past medical history, past social history, past surgical history, and problem list.  Review of Systems Pertinent items are noted in HPI.   Objective:   Blood pressure 104/71, pulse 93, height 5\' 1"  (1.549 m), weight 197 lb 3.2 oz (89.4 kg), last menstrual period 04/18/2023, not currently breastfeeding. Body mass index is 37.26 kg/m. Well nourished, well hydrated [place], no apparent distress  Abd- obese, benign Pelvic EG- normal Vagina/cervix- white discharge  Assessment:   1. History of gestational diabetes mellitus (GDM)   2. Screening for cervical cancer   3. Preventative health care   4. Weight gain   5. Acne vulgaris   6. Abnormal facial hair   7. Irregular periods      Plan:     4. Weight gain  - TSH + free T4  5. Acne vulgaris  - Testosterone, Free, Total, SHBG  6. Abnormal facial hair  - Testosterone, Free, Total, SHBG  7. Irregular periods  - FSH/LH   I rec'd healthy eating habits and exercise.

## 2023-07-08 NOTE — Progress Notes (Signed)
 Requests "testing for PCOS". Recent delivery 07/2022. Reports weight gain, facial hair, acne.

## 2023-07-09 LAB — CERVICOVAGINAL ANCILLARY ONLY
Bacterial Vaginitis (gardnerella): NEGATIVE
Candida Glabrata: NEGATIVE
Candida Vaginitis: NEGATIVE
Chlamydia: NEGATIVE
Comment: NEGATIVE
Comment: NEGATIVE
Comment: NEGATIVE
Comment: NEGATIVE
Comment: NEGATIVE
Comment: NORMAL
Neisseria Gonorrhea: NEGATIVE
Trichomonas: NEGATIVE

## 2023-07-12 LAB — FSH/LH
FSH: 4.9 m[IU]/mL
LH: 14 m[IU]/mL

## 2023-07-12 LAB — TESTOSTERONE, FREE, TOTAL, SHBG
Sex Hormone Binding: 13.4 nmol/L — ABNORMAL LOW (ref 24.6–122.0)
Testosterone, Free: 2.7 pg/mL (ref 0.0–4.2)
Testosterone: 49 ng/dL (ref 13–71)

## 2023-07-12 LAB — TSH+FREE T4
Free T4: 1.23 ng/dL (ref 0.82–1.77)
TSH: 0.788 u[IU]/mL (ref 0.450–4.500)

## 2023-07-15 ENCOUNTER — Encounter: Payer: Self-pay | Admitting: Obstetrics & Gynecology

## 2023-07-15 ENCOUNTER — Ambulatory Visit (INDEPENDENT_AMBULATORY_CARE_PROVIDER_SITE_OTHER): Admitting: Obstetrics & Gynecology

## 2023-07-15 VITALS — BP 130/75 | HR 102 | Wt 201.0 lb

## 2023-07-15 DIAGNOSIS — E282 Polycystic ovarian syndrome: Secondary | ICD-10-CM | POA: Diagnosis not present

## 2023-07-15 DIAGNOSIS — Z319 Encounter for procreative management, unspecified: Secondary | ICD-10-CM | POA: Diagnosis not present

## 2023-07-15 MED ORDER — METFORMIN HCL 500 MG PO TABS: ORAL_TABLET | ORAL | 6 refills | Status: AC

## 2023-07-15 NOTE — Progress Notes (Signed)
    GYNECOLOGY PROGRESS NOTE  Subjective:    Patient ID: Kathryn Doyle, female    DOB: 01-08-2001, 23 y.o.   MRN: [id number]  HPI  Patient is a 23 y.o. G1P1001 single (1 yo son) here today to discuss her labs from last week. Her complaint is that of weight gain, unwanted facial hair and acne. Her TSH and free testosterone were normal. Her LH/FSH ratio was 3:1.  She uses condoms for contraception. She does want a pregnancy in the near future. She plans to start taking MVI/PNVs. She had GDM during her pregnancy. She reports a normal GTT postpartum at [facility].  She reports that she gets her period every 2-3 months and it will last a week.  The following portions of the patient's history were reviewed and updated as appropriate: allergies, current medications, past family history, past medical history, past social history, past surgical history, and problem list.  Review of Systems Pertinent items are noted in HPI.   Objective:   Blood pressure 130/75, pulse (!) 102, weight 201 lb (91.2 kg), last menstrual period 04/18/2023, not currently breastfeeding. Body mass index is 37.98 kg/m. Well nourished, well hydrated Latina, no apparent distress    Assessment:   Probable PCOOS Morbid obesity, gained another 4 pounds since last visit.   Plan:   Rec healthy life choices including diet and exercise. Metformin start with 500 mg daily, gradually increase to 1000 mg BID Rec MVI daily at least 8 weeks prior to conception

## 2023-07-16 ENCOUNTER — Encounter: Payer: Self-pay | Admitting: Obstetrics & Gynecology

## 2023-07-16 LAB — COMPREHENSIVE METABOLIC PANEL
ALT: 30 IU/L (ref 0–32)
AST: 19 IU/L (ref 0–40)
Albumin: 4.1 g/dL (ref 4.0–5.0)
Alkaline Phosphatase: 82 IU/L (ref 44–121)
BUN/Creatinine Ratio: 18 (ref 9–23)
BUN: 12 mg/dL (ref 6–20)
Bilirubin Total: 0.3 mg/dL (ref 0.0–1.2)
CO2: 21 mmol/L (ref 20–29)
Calcium: 8.9 mg/dL (ref 8.7–10.2)
Chloride: 106 mmol/L (ref 96–106)
Creatinine, Ser: 0.68 mg/dL (ref 0.57–1.00)
Globulin, Total: 3 g/dL (ref 1.5–4.5)
Glucose: 110 mg/dL — ABNORMAL HIGH (ref 70–99)
Potassium: 4.2 mmol/L (ref 3.5–5.2)
Sodium: 142 mmol/L (ref 134–144)
Total Protein: 7.1 g/dL (ref 6.0–8.5)
eGFR: 127 mL/min/{1.73_m2} (ref >59)

## 2023-07-16 LAB — LIPID PANEL
Chol/HDL Ratio: 4.3 ratio (ref 0.0–4.4)
Cholesterol, Total: 152 mg/dL (ref 100–199)
HDL: 35 mg/dL — ABNORMAL LOW (ref >39)
LDL Chol Calc (NIH): 66 mg/dL (ref 0–99)
Triglycerides: 319 mg/dL — ABNORMAL HIGH (ref 0–149)
VLDL Cholesterol Cal: 51 mg/dL — ABNORMAL HIGH (ref 5–40)

## 2023-09-03 ENCOUNTER — Encounter: Payer: Self-pay | Admitting: Obstetrics and Gynecology

## 2023-09-03 NOTE — Progress Notes (Deleted)
 GYNECOLOGY  VISIT   HPI: Kathryn Doyle is a 23 y.o.   Single  {Race/ethnicity:17218}  female  G1P1001 here for f/u PCOS on metformin .     Last office visit 07/17/23 she was prescribed metformin  and advised lifestyle modifications for weight loss.    GYNECOLOGIC HISTORY: No LMP recorded. Contraception: {contraception:315051}.   Menopausal hormone therapy:  *** Last mammogram:  *** Last pap smear: No results found for: "DIAGPAP"         OB History     Gravida  1   Para  1   Term  1   Preterm  0   AB  0   Living  1      SAB  0   IAB  0   Ectopic  0   Multiple  0   Live Births  1              Patient Active Problem List   Diagnosis Date Noted   Obesity (BMI 35.0-39.9 without comorbidity) 07/08/2023   Irregular periods 07/08/2023   Acne vulgaris 07/08/2023   Weight gain 07/08/2023   Abnormal facial hair 07/08/2023   GDM, class A1 07/28/2022   Ovarian cyst 06/20/2022   Gestational diabetes mellitus 05/04/2022    Past Medical History:  Diagnosis Date   Episodic tension-type headache, not intractable 07/01/2018   Gestational diabetes    Migraine without aura and without status migrainosus, not intractable 07/01/2018    Past Surgical History:  Procedure Laterality Date   CHOLECYSTECTOMY  02/2020    Current Outpatient Medications  Medication Sig Dispense Refill   acetaminophen  (TYLENOL ) 500 MG tablet Take 2 tablets (1,000 mg total) by mouth every 6 (six) hours as needed for mild pain or moderate pain. (Patient not taking: Reported on 07/08/2023) 60 tablet 0   ibuprofen  (ADVIL ) 600 MG tablet Take 1 tablet (600 mg total) by mouth every 6 (six) hours as needed for moderate pain, mild pain or cramping. (Patient not taking: Reported on 07/08/2023) 60 tablet 0   metFORMIN  (GLUCOPHAGE ) 500 MG tablet Start with 500 mg at bedtime and gradually increase to take 1000mg  BID 120 tablet 6   polyethylene glycol powder (GLYCOLAX /MIRALAX ) 17 GM/SCOOP powder Mix 17 g in  beverage and take by mouth daily as needed. (Patient not taking: Reported on 07/08/2023) 510 g 1   No current facility-administered medications for this visit.     ALLERGIES: Patient has no known allergies.  Family History  Problem Relation Age of Onset   Stroke Father    Hypertension Father    Diabetes Father    Hypertension Mother    Diabetes Mother    Leukemia Other     Social History   Socioeconomic History   Marital status: Single    Spouse name: Not on file   Number of children: Not on file   Years of education: Not on file   Highest education level: Not on file  Occupational History   Not on file  Tobacco Use   Smoking status: Never   Smokeless tobacco: Never  Vaping Use   Vaping status: Never Used  Substance and Sexual Activity   Alcohol use: No    Alcohol/week: 0.0 standard drinks of alcohol   Drug use: No   Sexual activity: Yes    Partners: Male    Birth control/protection: Condom    Comment: currently pregnant  Other Topics Concern   Not on file  Social History Narrative   Kathryn Doyle is an 11th  grade student.   She attends [employer].   She lives with both parents. She has six sisters.   She enjoys sleeping, shopping, and watching tv.   Social Drivers of Corporate investment banker Strain: Low Risk  (06/26/2023)   Received from [facility]   Overall Financial Resource Strain (CARDIA)    Difficulty of Paying Living Expenses: Not hard at all  Food Insecurity: No Food Insecurity (06/26/2023)   Received from [facility]   Hunger Vital Sign    Worried About Running Out of Food in the Last Year: Never true    Ran Out of Food in the Last Year: Never true  Transportation Needs: No Transportation Needs (06/26/2023)   Received from [facility] - Transportation    Lack of Transportation (Medical): No    Lack of Transportation (Non-Medical): No  Physical Activity: Sufficiently Active (06/26/2023)   Received from [facility]   Exercise  Vital Sign    Days of Exercise per Week: 5 days    Minutes of Exercise per Session: 30 min  Recent Concern: Physical Activity - Insufficiently Active (03/28/2023)   Received from [facility]   Exercise Vital Sign    Days of Exercise per Week: 3 days    Minutes of Exercise per Session: 30 min  Stress: No Stress Concern Present (06/26/2023)   Received from [facility] - Occupational Stress Questionnaire    Feeling of Stress : Not at all  Social Connections: Socially Integrated (06/26/2023)   Received from [facility]   Social Network    How would you rate your social network (family, work, friends)?: Good participation with social networks  Intimate Partner Violence: Not At Risk (06/26/2023)   Received from [facility]   HITS    Over the last 12 months how often did your partner physically hurt you?: Never    Over the last 12 months how often did your partner insult you or talk down to you?: Never    Over the last 12 months how often did your partner threaten you with physical harm?: Never    Over the last 12 months how often did your partner scream or curse at you?: Never    Review of Systems  PHYSICAL EXAMINATION:    There were no vitals taken for this visit.    General appearance: alert, cooperative and appears stated age Head: Normocephalic, without obvious abnormality, atraumatic Neck: no adenopathy, supple, symmetrical, trachea midline and thyroid  normal to inspection and palpation Lungs: clear to auscultation bilaterally Breasts: normal appearance, no masses or tenderness, No nipple retraction or dimpling, No nipple discharge or bleeding, No axillary or supraclavicular adenopathy Heart: regular rate and rhythm Abdomen: soft, non-tender, no masses,  no organomegaly Extremities: extremities normal, atraumatic, no cyanosis or edema Skin: Skin color, texture, turgor normal. No rashes or lesions Lymph nodes: Cervical,  supraclavicular, and axillary nodes normal. No abnormal inguinal nodes palpated Neurologic: Grossly normal  Pelvic: External genitalia:  no lesions              Urethra:  normal appearing urethra with no masses, tenderness or lesions              Bartholins and Skenes: normal                 Vagina: normal appearing vagina with normal color and discharge, no lesions  Cervix: no lesions                Bimanual Exam:  Uterus:  normal size, contour, position, consistency, mobility, non-tender              Adnexa: no mass, fullness, tenderness              Rectal exam: {yes no:314532}.  Confirms.              Anus:  normal sphincter tone, no lesions  Chaperone was present for exam  ASSESSMENT & PLAN       An After Visit Summary was printed and given to the patient.   Trysta Showman E Adalene Gulotta, PA-C 4/22/20256:27 PM

## 2023-09-04 ENCOUNTER — Ambulatory Visit: Admitting: Physician Assistant

## 2023-09-11 ENCOUNTER — Encounter: Payer: Self-pay | Admitting: Obstetrics and Gynecology
